# Patient Record
Sex: Female | Born: 1970 | Race: Black or African American | Hispanic: No | Marital: Single | Smoking: Never smoker
Health system: Southern US, Community
[De-identification: ages and names within clinical notes are randomized; demographics above are authoritative.]

## PROBLEM LIST (undated history)

## (undated) DIAGNOSIS — Q600 Renal agenesis, unilateral: Secondary | ICD-10-CM

## (undated) DIAGNOSIS — D631 Anemia in chronic kidney disease: Secondary | ICD-10-CM

## (undated) DIAGNOSIS — N19 Unspecified kidney failure: Secondary | ICD-10-CM

## (undated) DIAGNOSIS — N189 Chronic kidney disease, unspecified: Secondary | ICD-10-CM

## (undated) DIAGNOSIS — E7800 Pure hypercholesterolemia, unspecified: Secondary | ICD-10-CM

## (undated) DIAGNOSIS — I10 Essential (primary) hypertension: Secondary | ICD-10-CM

## (undated) DIAGNOSIS — G43909 Migraine, unspecified, not intractable, without status migrainosus: Secondary | ICD-10-CM

## (undated) HISTORY — PX: THYROIDECTOMY, PARTIAL: SHX18

## (undated) HISTORY — PX: RENAL BIOPSY, PERCUTANEOUS: SUR144

## (undated) HISTORY — PX: KIDNEY TRANSPLANT: SHX239

## (undated) HISTORY — PX: ARTERIOVENOUS FISTULA CREATION FEMORAL: SHX6647

---

## 2010-02-16 ENCOUNTER — Emergency Department: Payer: Self-pay | Admitting: Emergency Medicine

## 2010-02-25 ENCOUNTER — Encounter: Payer: Self-pay | Admitting: Family Medicine

## 2010-03-06 ENCOUNTER — Encounter: Payer: Self-pay | Admitting: Family Medicine

## 2010-04-05 ENCOUNTER — Encounter: Payer: Self-pay | Admitting: Family Medicine

## 2011-02-02 ENCOUNTER — Emergency Department: Payer: Self-pay | Admitting: Emergency Medicine

## 2011-02-03 ENCOUNTER — Ambulatory Visit: Payer: Self-pay | Admitting: Internal Medicine

## 2011-02-04 ENCOUNTER — Ambulatory Visit: Payer: Self-pay | Admitting: Internal Medicine

## 2011-03-07 ENCOUNTER — Ambulatory Visit: Payer: Self-pay | Admitting: Internal Medicine

## 2011-04-06 ENCOUNTER — Ambulatory Visit: Payer: Self-pay | Admitting: Internal Medicine

## 2011-05-12 ENCOUNTER — Ambulatory Visit: Payer: Self-pay | Admitting: Internal Medicine

## 2011-06-06 ENCOUNTER — Ambulatory Visit: Payer: Self-pay | Admitting: Internal Medicine

## 2011-07-30 ENCOUNTER — Emergency Department: Payer: Self-pay | Admitting: Emergency Medicine

## 2011-09-17 ENCOUNTER — Ambulatory Visit: Payer: Self-pay | Admitting: Internal Medicine

## 2011-10-07 ENCOUNTER — Ambulatory Visit: Payer: Self-pay | Admitting: Internal Medicine

## 2011-10-27 ENCOUNTER — Emergency Department: Payer: Self-pay | Admitting: *Deleted

## 2012-03-20 ENCOUNTER — Emergency Department: Payer: Self-pay | Admitting: Emergency Medicine

## 2012-10-09 ENCOUNTER — Emergency Department: Payer: Self-pay | Admitting: Emergency Medicine

## 2013-06-07 IMAGING — CT CT HEAD WITHOUT CONTRAST
2 series · 16 of 30 positions shown, 20 images · non-contrast
Comparison: none

REASON FOR EXAM: heradache
COMMENTS:

[Series 2: without · axial · non-contrast · 0.40mm/px · z∈[-116,+4]mm · 13 of 30 slices shown, 17 images]
[im 3/30  brain]
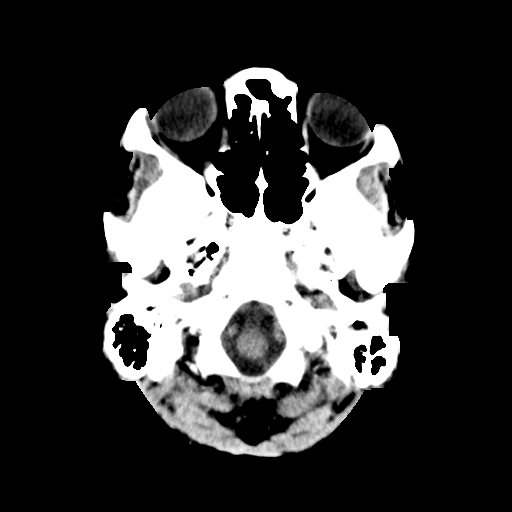
[im 3/30  bone]
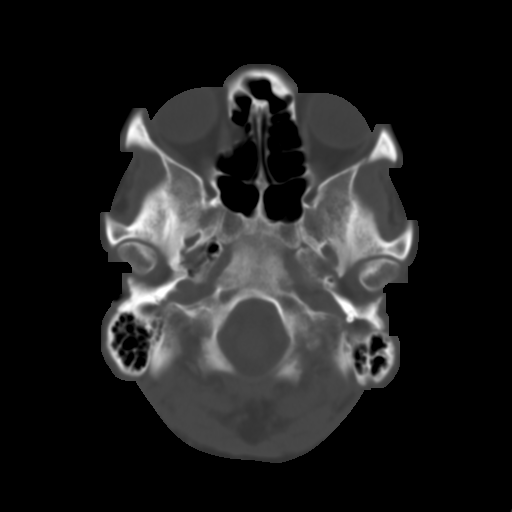
[im 5/30  brain]
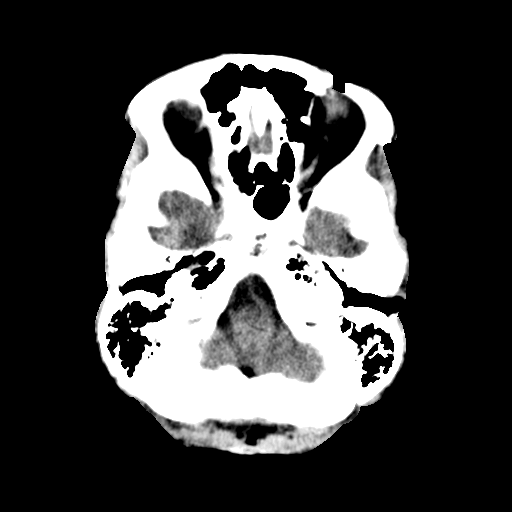
[im 7/30  brain]
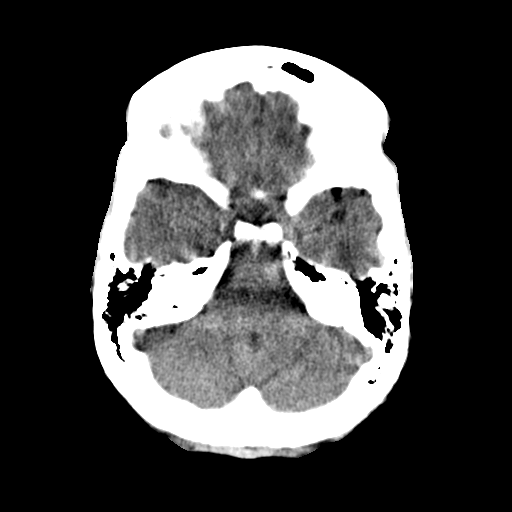
[im 9/30  brain]
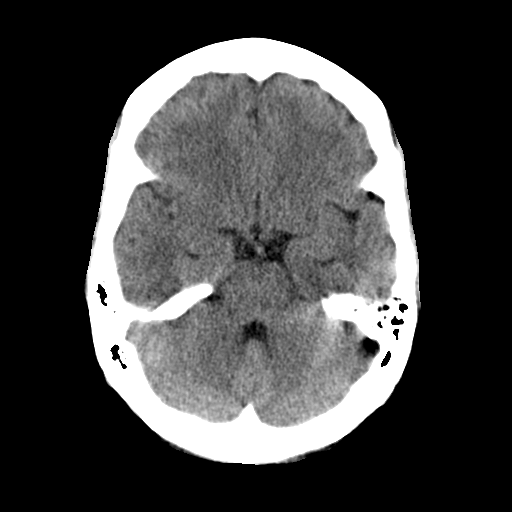
[im 11/30  brain]
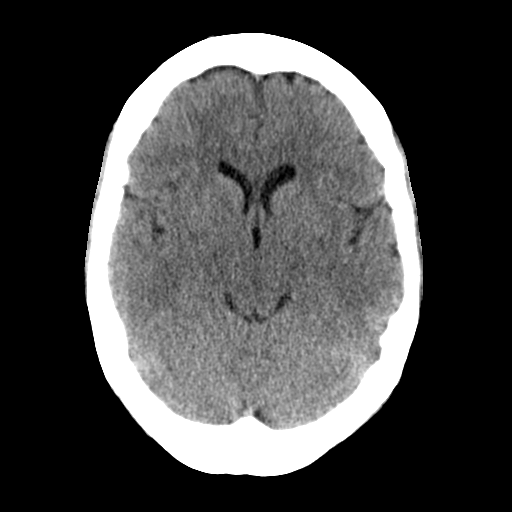
[im 11/30  bone]
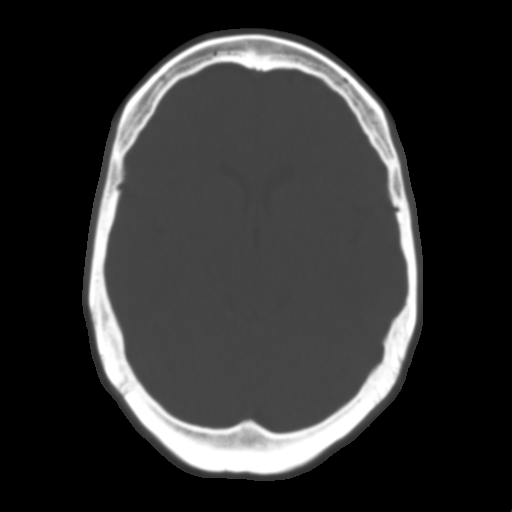
[im 13/30  brain]
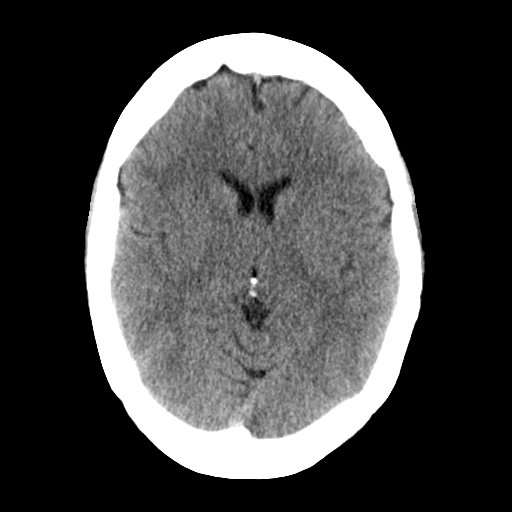
[im 15/30  brain]
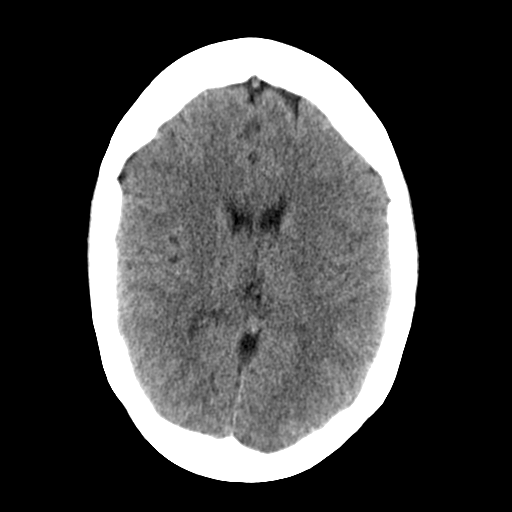
[im 17/30  brain]
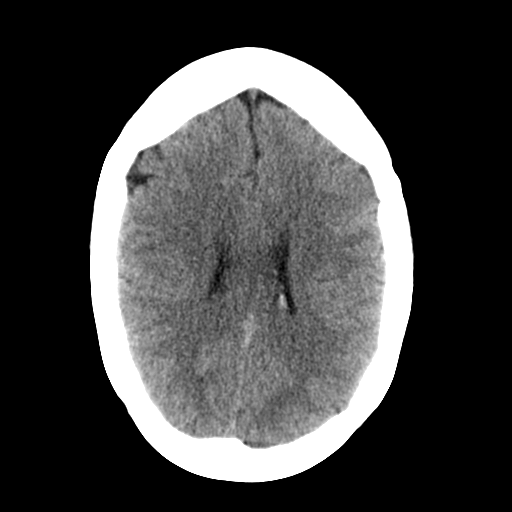
[im 19/30  brain]
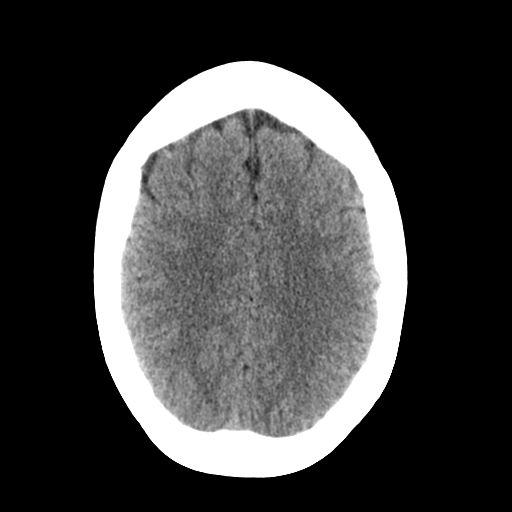
[im 19/30  bone]
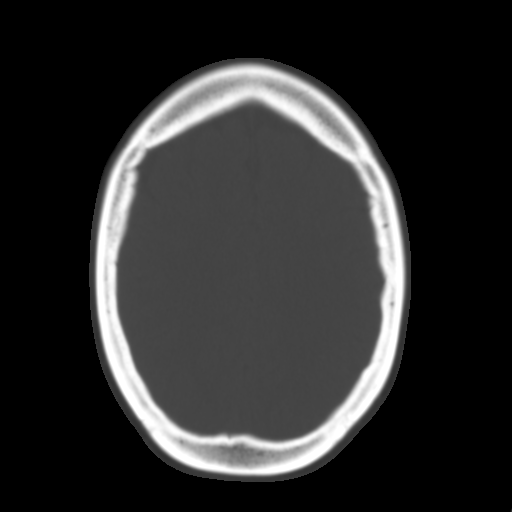
[im 21/30  brain]
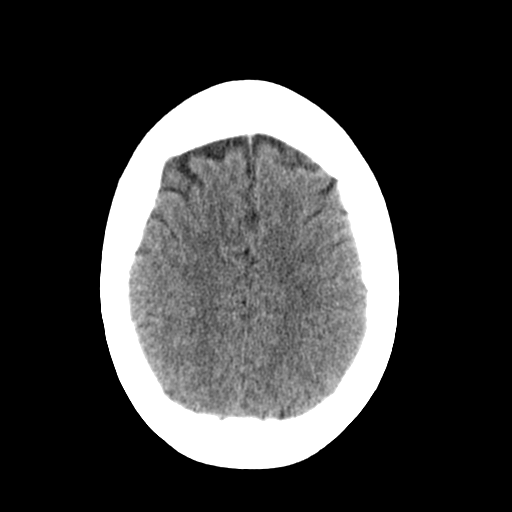
[im 23/30  brain]
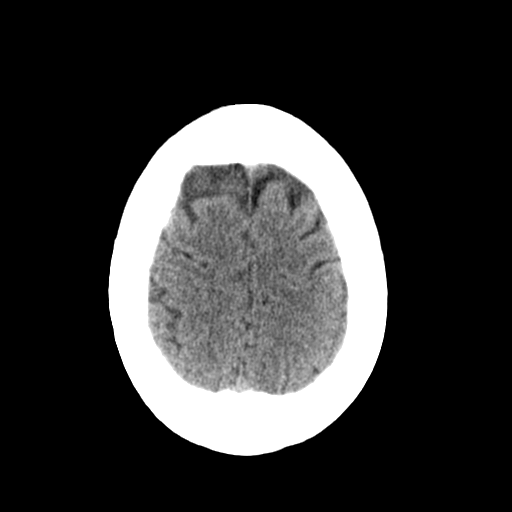
[im 25/30  brain]
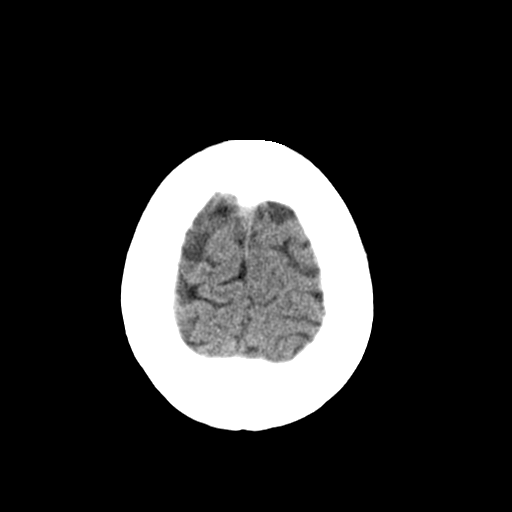
[im 27/30  brain]
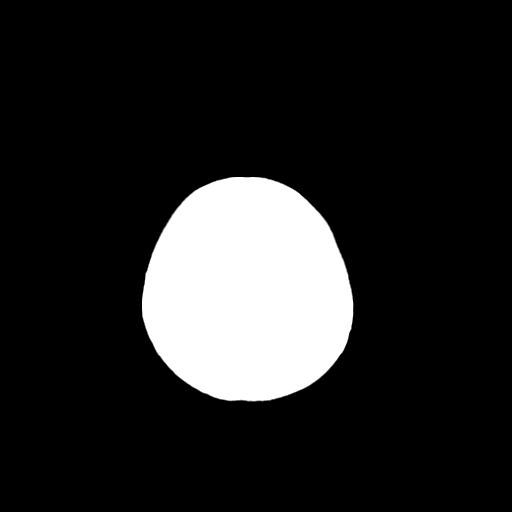
[im 27/30  bone]
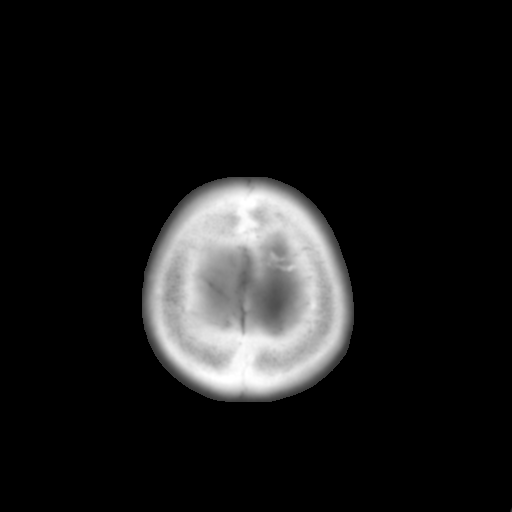

[Series 3: bone · axial · 0.40mm/px · z∈[-116,-76]mm · 3 of 30 slices shown]
[im 3/30  bone]
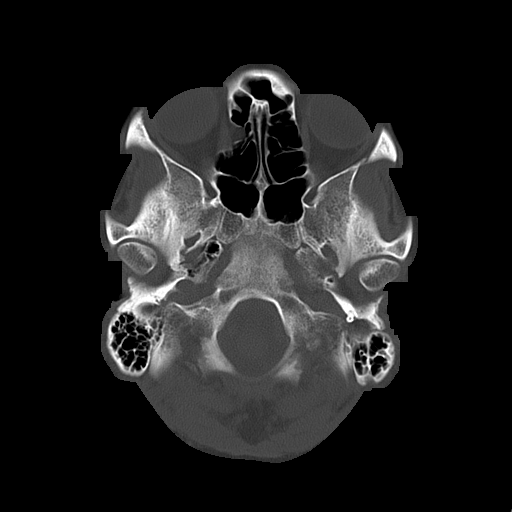
[im 7/30  bone]
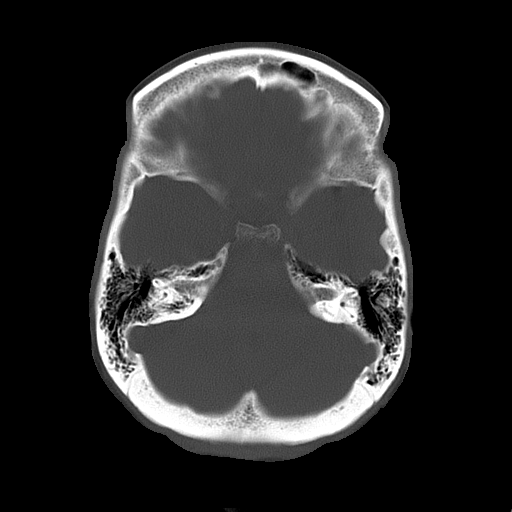
[im 11/30  bone]
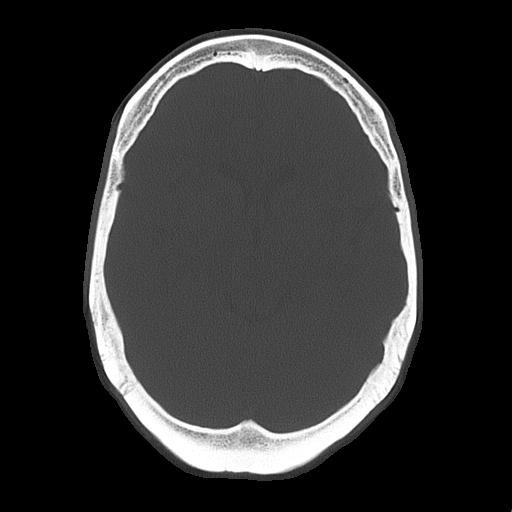

[16 of 30 positions shown; findings below may reference images not displayed]

PROCEDURE:     CT  - CT HEAD WITHOUT CONTRAST  - July 30, 2011  [DATE]

RESULT:     Axial noncontrast CT scanning was performed through the brain at
5 mm intervals and slice thicknesses.

The ventricles are normal in size and position. There is no intracranial
hemorrhage nor intracranial mass effect. The cerebellum and brainstem are
normal in density. At bone window settings the observed portions of the
paranasal sinuses and mastoid air cells are clear. There is no evidence of
an acute skull fracture.
IMPRESSION: I see no acute intracranial abnormality.

## 2013-08-06 ENCOUNTER — Emergency Department: Payer: Self-pay | Admitting: Emergency Medicine

## 2013-08-06 LAB — CBC
HCT: 24.5 % — ABNORMAL LOW (ref 35.0–47.0)
HGB: 7.5 g/dL — ABNORMAL LOW (ref 12.0–16.0)
MCH: 23.3 pg — ABNORMAL LOW (ref 26.0–34.0)
MCHC: 30.8 g/dL — ABNORMAL LOW (ref 32.0–36.0)
MCV: 76 fL — ABNORMAL LOW (ref 80–100)
RDW: 19.6 % — ABNORMAL HIGH (ref 11.5–14.5)

## 2013-08-06 LAB — COMPREHENSIVE METABOLIC PANEL
Albumin: 2.8 g/dL — ABNORMAL LOW (ref 3.4–5.0)
Anion Gap: 6 — ABNORMAL LOW (ref 7–16)
Chloride: 110 mmol/L — ABNORMAL HIGH (ref 98–107)
Creatinine: 3.49 mg/dL — ABNORMAL HIGH (ref 0.60–1.30)
EGFR (African American): 18 — ABNORMAL LOW
EGFR (Non-African Amer.): 15 — ABNORMAL LOW
Osmolality: 280 (ref 275–301)

## 2013-08-07 LAB — URINALYSIS, COMPLETE
Glucose,UR: NEGATIVE mg/dL (ref 0–75)
Ketone: NEGATIVE
Leukocyte Esterase: NEGATIVE
Nitrite: NEGATIVE
Protein: >=500
Specific Gravity: 1.009 (ref 1.003–1.030)

## 2013-11-26 ENCOUNTER — Ambulatory Visit: Payer: Self-pay | Admitting: Family Medicine

## 2013-12-27 ENCOUNTER — Ambulatory Visit: Payer: Self-pay | Admitting: Family Medicine

## 2014-07-05 ENCOUNTER — Emergency Department: Payer: Self-pay | Admitting: Emergency Medicine

## 2014-07-05 LAB — MAGNESIUM: Magnesium: 1.7 mg/dL — ABNORMAL LOW

## 2014-07-05 LAB — COMPREHENSIVE METABOLIC PANEL
ALT: 12 U/L — AB
ANION GAP: 7 (ref 7–16)
Albumin: 2.6 g/dL — ABNORMAL LOW (ref 3.4–5.0)
Alkaline Phosphatase: 68 U/L
BUN: 31 mg/dL — AB (ref 7–18)
Bilirubin,Total: 0.5 mg/dL (ref 0.2–1.0)
CALCIUM: 8.6 mg/dL (ref 8.5–10.1)
CO2: 21 mmol/L (ref 21–32)
CREATININE: 4.05 mg/dL — AB (ref 0.60–1.30)
Chloride: 110 mmol/L — ABNORMAL HIGH (ref 98–107)
EGFR (African American): 15 — ABNORMAL LOW
EGFR (Non-African Amer.): 13 — ABNORMAL LOW
Glucose: 72 mg/dL (ref 65–99)
Osmolality: 281 (ref 275–301)
Potassium: 4.7 mmol/L (ref 3.5–5.1)
SGOT(AST): 18 U/L (ref 15–37)
SODIUM: 138 mmol/L (ref 136–145)
Total Protein: 6.6 g/dL (ref 6.4–8.2)

## 2014-07-05 LAB — CBC
HCT: 36.9 % (ref 35.0–47.0)
HGB: 11.9 g/dL — ABNORMAL LOW (ref 12.0–16.0)
MCH: 28.8 pg (ref 26.0–34.0)
MCHC: 32.4 g/dL (ref 32.0–36.0)
MCV: 89 fL (ref 80–100)
Platelet: 141 10*3/uL — ABNORMAL LOW (ref 150–440)
RBC: 4.15 10*6/uL (ref 3.80–5.20)
RDW: 14.4 % (ref 11.5–14.5)
WBC: 4.6 10*3/uL (ref 3.6–11.0)

## 2014-07-05 LAB — PHOSPHORUS: Phosphorus: 3 mg/dL (ref 2.5–4.9)

## 2015-11-05 IMAGING — US US BREAST*R* LIMITED INC AXILLA
1 series · 13 of 25 positions shown · non-contrast
Comparison: Screening mammogram dated 11/27/2013.

CLINICAL DATA: Possible bilateral breast masses at recent screening
mammography.

EXAM:
DIGITAL DIAGNOSTIC  BILATERAL MAMMOGRAM WITH CAD
ULTRASOUND BILATERAL BREAST

[Series 1: us breast*right* limited inc axilla · 0.08mm/px · 13 of 26 slices shown]
[im 1/26]
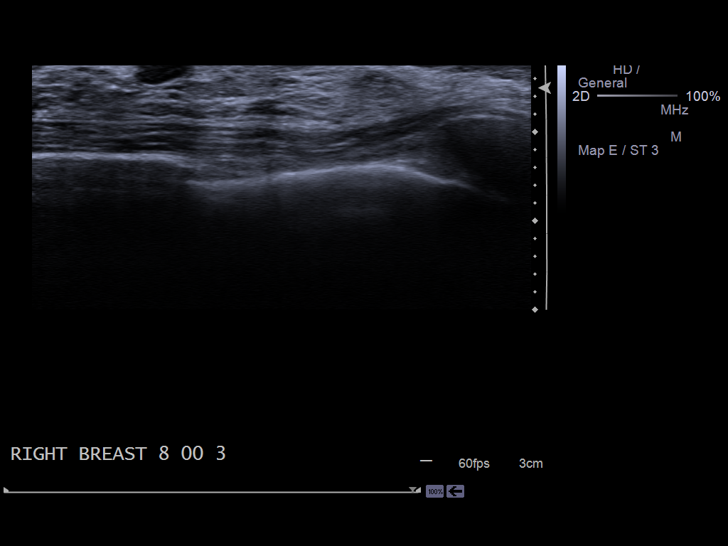
[im 3/26]
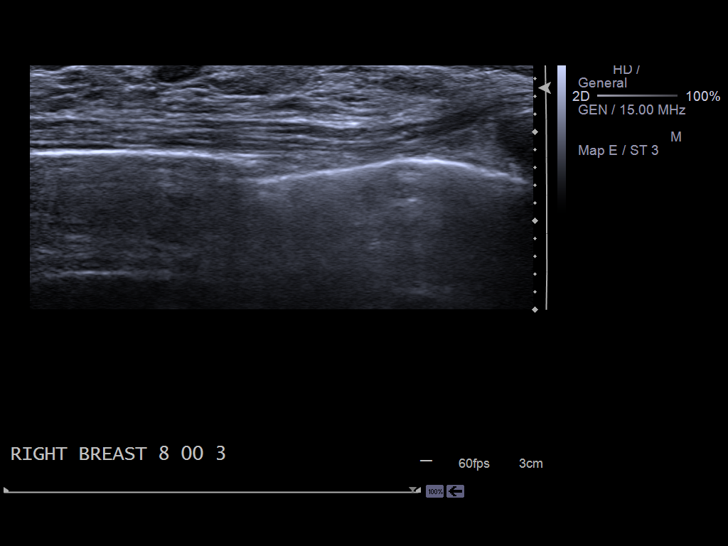
[im 5/26]
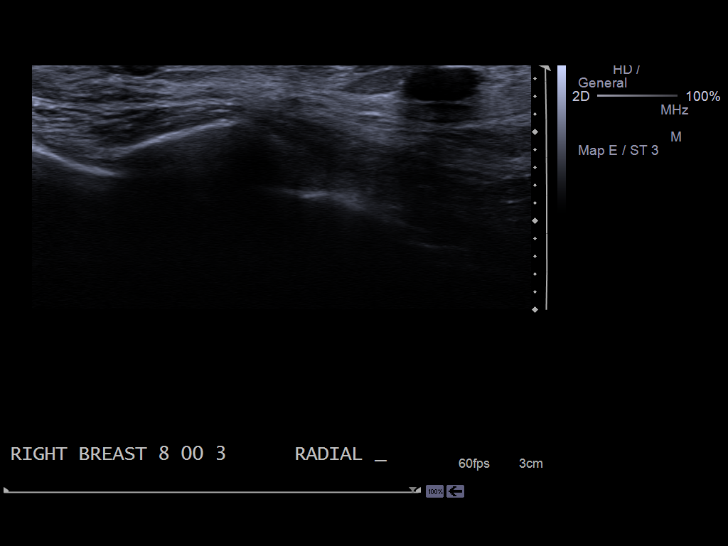
[im 7/26]
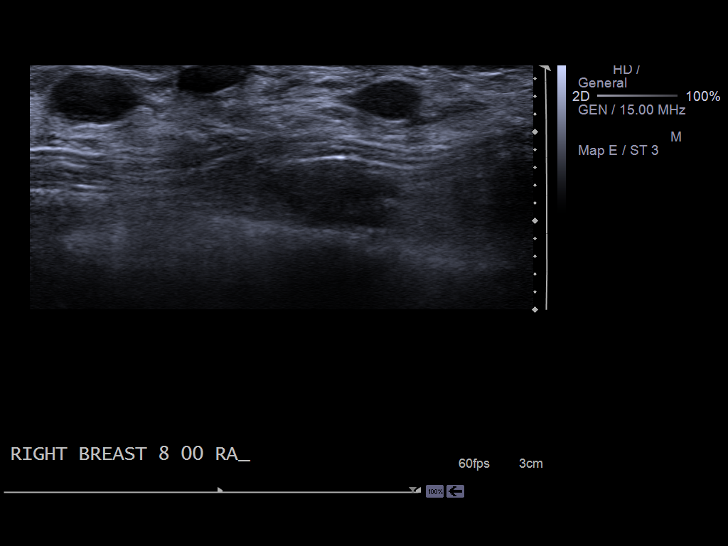
[im 9/26]
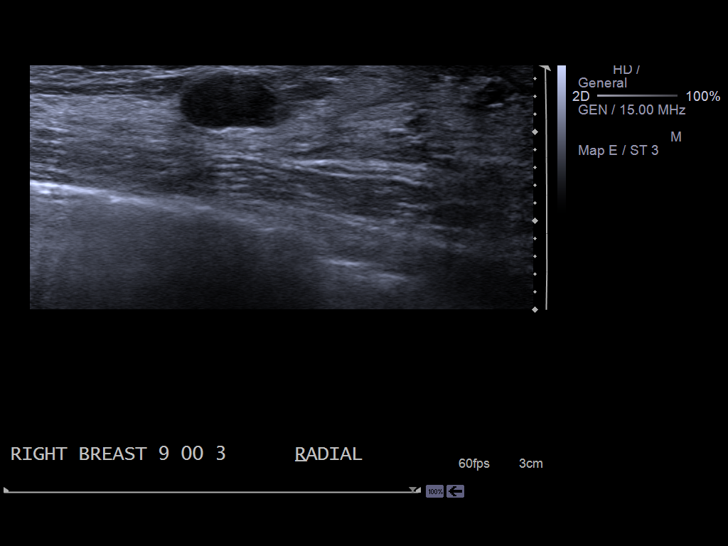
[im 11/26]
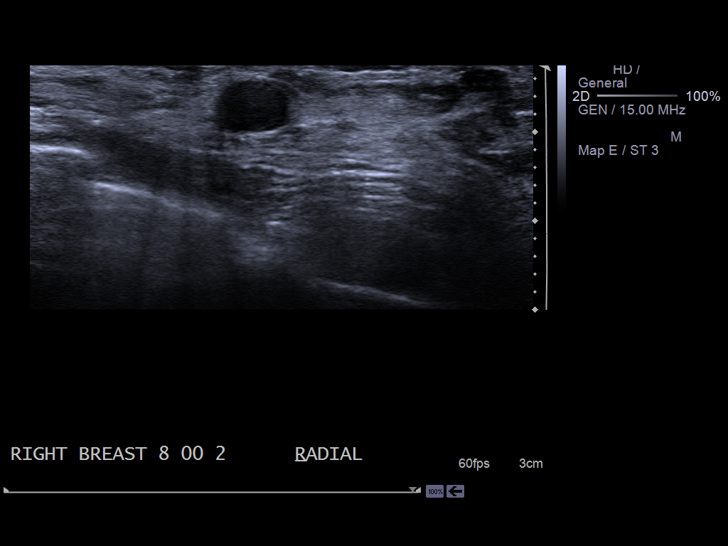
[im 13/26]
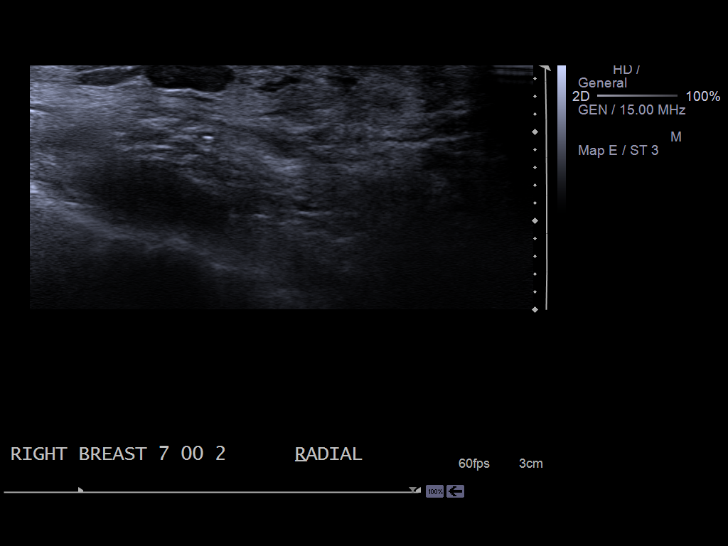
[im 15/26]
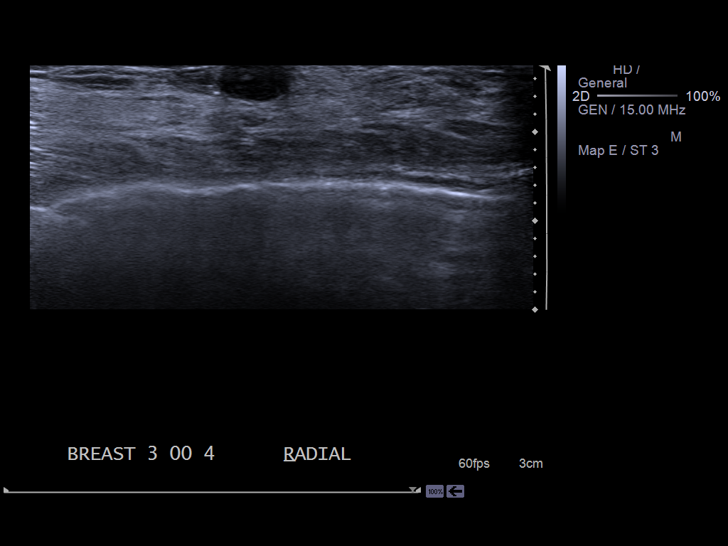
[im 17/26]
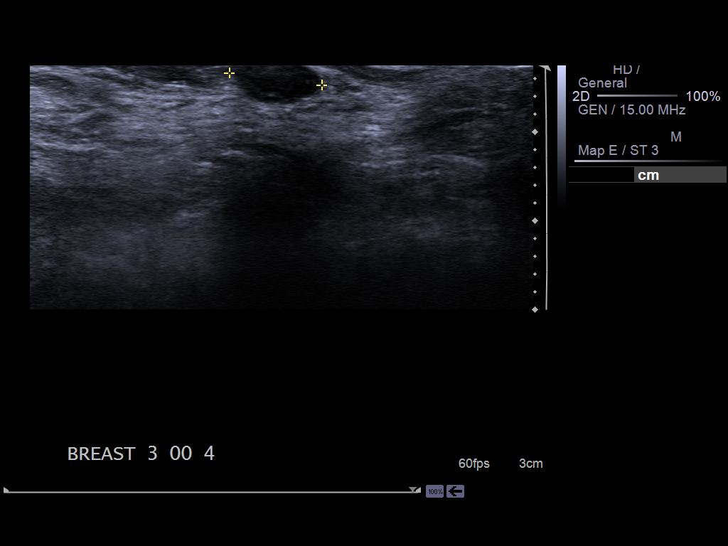
[im 19/26]
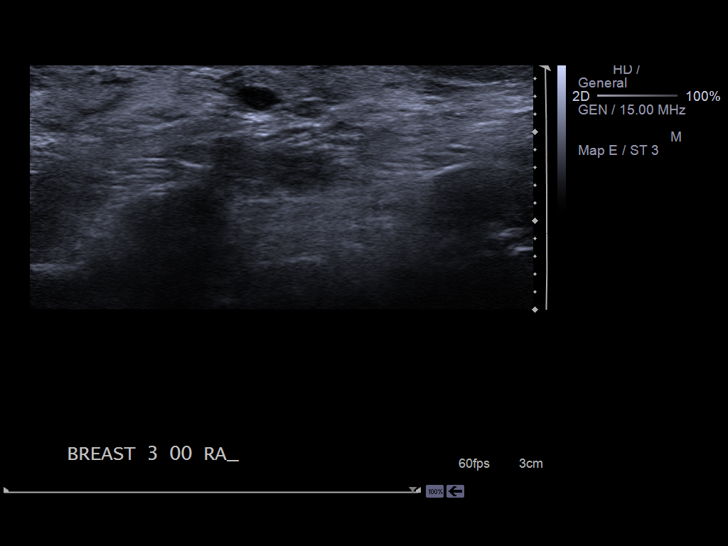
[im 21/26]
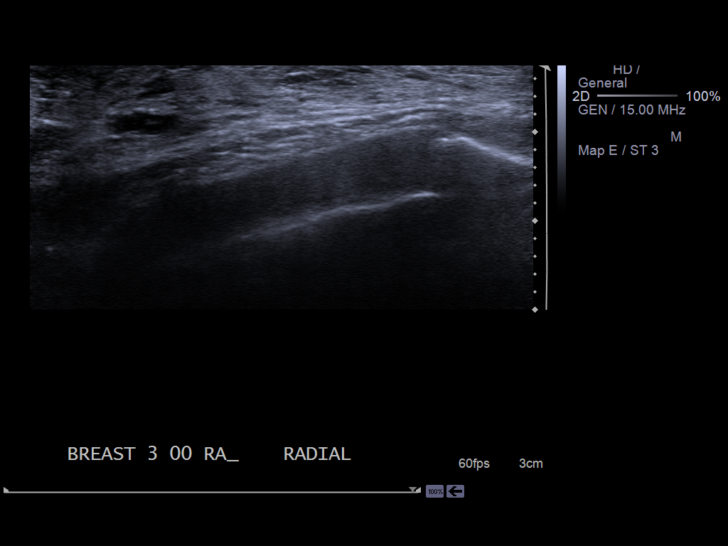
[im 23/26]
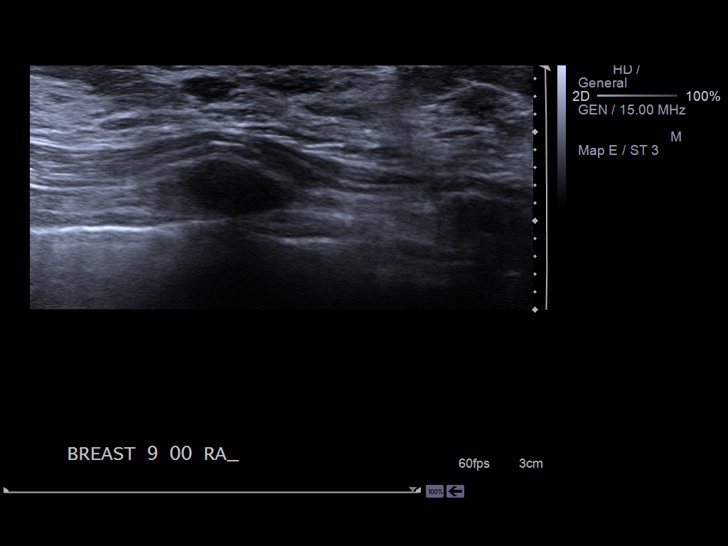
[im 26/26]
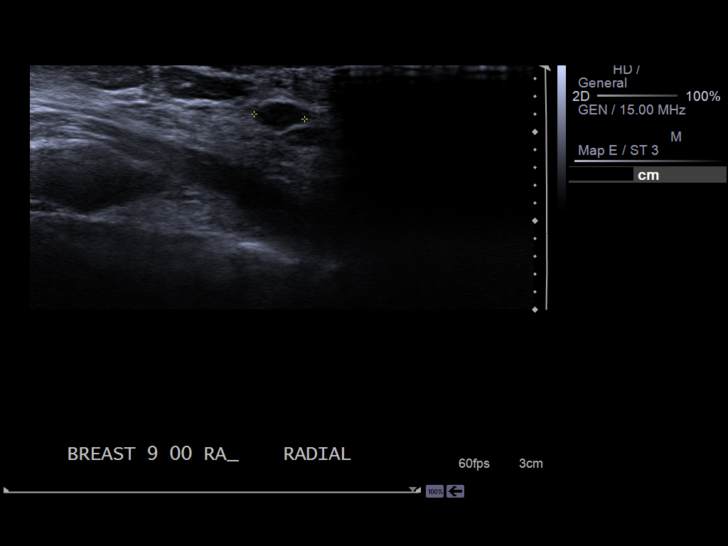

[13 of 25 positions shown; findings below may reference images not displayed]

ACR Breast Density Category c: The breast tissue is heterogeneously
dense, which may obscure small masses.
FINDINGS: True lateral and spot compression views of the left breast confirm a
partially obscured rounded mass in the central portion of the
breast. The visualized margins are circumscribed.

Spot compression views of the right breast confirm 2 oval, partially
obscured masses in the lateral right breast. The visualized margins
are circumscribed.

Mammographic images were processed with CAD.

On physical exam, there are several palpable areas of nodularity
measuring 1 cm or less in diameter in the lower outer retroareolar
and periareolar region of the right breast. No palpable abnormality
in the left breast.

Ultrasound is performed, showing multiple oval, horizontally
oriented, circumscribed, hypoechoic masses in both breasts. These
are all identical in appearance. The largest is in the 9 o'clock
position of the right breast, 3 cm from the nipple, measuring 1.1 cm
in maximum diameter.
IMPRESSION: Multiple bilateral fibroadenomata.  No evidence of malignancy.

RECOMMENDATION:
Bilateral screening mammogram in 1 year.

I have discussed the findings and recommendations with the patient.
Results were also provided in writing at the conclusion of the
visit.

BI-RADS CATEGORY  2: Benign finding(s).

## 2016-05-24 ENCOUNTER — Other Ambulatory Visit: Payer: Self-pay | Admitting: Family Medicine

## 2016-05-24 DIAGNOSIS — Z1231 Encounter for screening mammogram for malignant neoplasm of breast: Secondary | ICD-10-CM

## 2017-05-21 ENCOUNTER — Encounter: Payer: Self-pay | Admitting: Gynecology

## 2017-05-21 ENCOUNTER — Ambulatory Visit
Admission: EM | Admit: 2017-05-21 | Discharge: 2017-05-21 | Disposition: A | Discharge status: Home / Self Care | Payer: 59 | Attending: Emergency Medicine | Admitting: Emergency Medicine

## 2017-05-21 DIAGNOSIS — R0982 Postnasal drip: Secondary | ICD-10-CM

## 2017-05-21 DIAGNOSIS — J029 Acute pharyngitis, unspecified: Secondary | ICD-10-CM | POA: Diagnosis not present

## 2017-05-21 HISTORY — DX: Pure hypercholesterolemia, unspecified: E78.00

## 2017-05-21 HISTORY — DX: Renal agenesis, unilateral: Q60.0

## 2017-05-21 HISTORY — DX: Anemia in chronic kidney disease: D63.1

## 2017-05-21 HISTORY — DX: Migraine, unspecified, not intractable, without status migrainosus: G43.909

## 2017-05-21 HISTORY — DX: Essential (primary) hypertension: I10

## 2017-05-21 HISTORY — DX: Chronic kidney disease, unspecified: N18.9

## 2017-05-21 HISTORY — DX: Unspecified kidney failure: N19

## 2017-05-21 LAB — RAPID STREP SCREEN (MED CTR MEBANE ONLY): STREPTOCOCCUS, GROUP A SCREEN (DIRECT): NEGATIVE

## 2017-05-21 MED ORDER — IPRATROPIUM BROMIDE 0.06 % NA SOLN: 2.0000 | Freq: Four times a day (QID) | NASAL | 0 refills | Status: DC

## 2017-05-21 MED ORDER — MAGIC MOUTHWASH W/LIDOCAINE: 10.0000 mL | Freq: Three times a day (TID) | ORAL | 0 refills | Status: DC | PRN

## 2017-05-21 NOTE — Discharge Instructions (Signed)
your rapid strep was negative today, so we have sent off a throat culture.  We will contact you and call in the appropriate antibiotics if your culture comes back positive for an infection requiring antibiotic treatment.  Give [place] a working phone number.   1 gram of Tylenol and 400- 600 mg ibuprofen together 3-4 times a day as needed for pain.  Make sure you drink plenty of extra fluids.  Some people find salt water gargles and  Traditional Medicinal's "Throat Coat" tea helpful. Take 5 mL of liquid Benadryl and 5 mL of Maalox. Mix it together, and then hold it in your mouth for as long as you can and then swallow. You may do this 4 times a day.    Go to www.goodrx.com to look up your medications. This will give you a list of where you can find your prescriptions at the most affordable prices. Or ask the pharmacist what the cash price is. This can be less expensive than what you would pay with insurance.

## 2017-05-21 NOTE — ED Provider Notes (Signed)
HPI  SUBJECTIVE:  Patient reports right-sided sore throat starting 1-2 days ago, got acutely worse last night. Sx worse with swallowing, yawning.  Sx better with nothing. Has been taking : cold/ Allergy geltabs, Listerine and increasing fluids  w/ o relief. States the pain radiates to her right ear. She reports burning on the top of her mouth starting an hour and a half ago  no Fever  No Cough, but the patient reports nasal congestion, rhinorrhea, postnasal drip No Myalgias No Headache No Rash     No Recent Strep Exposure but patient states that she has a grandson with URI-like symptoms  No Abdominal Pain No reflux sxs No Allergy sxs  No Breathing difficulty, voice changes. She states that she feels like the right side of her throat is very swollen No Drooling No stiff neck No Trismus No abx in past month.  No antipyretic in past 6-8 hrs  past medical history solitary kidney, end-stage renal disease on hemodialysis however she still makes urine. She has anemia secondary to chronic kidney disease, hypertension, migraines, GERD, allergies, PE status post IVC filter.  LMP: Second week of May. Denies possibility of being pregnant.  PMD: Dr. Jacklynn Bue at [facility].  Past Medical History:  Diagnosis Date  . Anemia in chronic kidney disease (CKD)   . Hypercholesteremia   . Hypertension   . Kidney failure   . Migraine   . Solitary kidney, congenital     Past Surgical History:  Procedure Laterality Date  . ARTERIOVENOUS FISTULA CREATION FEMORAL    . CESAREAN SECTION    . RENAL BIOPSY, PERCUTANEOUS      No family history on file.  Social History  Substance Use Topics  . Smoking status: Never Smoker  . Smokeless tobacco: Never Used  . Alcohol use No    No current facility-administered medications for this encounter.   Current Outpatient Prescriptions:  .  cetirizine (ZYRTEC) 10 MG tablet, Take 10 mg by mouth daily., Disp: , Rfl:  .  fluticasone (FLONASE) 50  MCG/ACT nasal spray, Place into both nostrils daily., Disp: , Rfl:  .  lidocaine-prilocaine (EMLA) cream, Apply 1 application topically as needed., Disp: , Rfl:  .  losartan (COZAAR) 100 MG tablet, Take 100 mg by mouth daily., Disp: , Rfl:  .  nortriptyline (PAMELOR) 10 MG capsule, Take 10 mg by mouth at bedtime., Disp: , Rfl:  .  omeprazole (PRILOSEC) 20 MG capsule, Take 20 mg by mouth daily., Disp: , Rfl:  .  sevelamer carbonate (RENVELA) 800 MG tablet, Take 800 mg by mouth 3 (three) times daily with meals., Disp: , Rfl:  .  simvastatin (ZOCOR) 10 MG tablet, Take 10 mg by mouth daily., Disp: , Rfl:  .  ipratropium (ATROVENT) 0.06 % nasal spray, Place 2 sprays into both nostrils 4 (four) times daily. 3-4 times/ day, Disp: 15 mL, Rfl: 0 .  magic mouthwash w/lidocaine SOLN, Take 10 mLs by mouth 3 (three) times daily as needed for mouth pain. Swish and spit do not swallow, Disp: 200 mL, Rfl: 0  Allergies  Allergen Reactions  . Codeine Nausea And Vomiting  . Iodine Swelling  . Shellfish Allergy Swelling     ROS  As noted in HPI.   Physical Exam  BP (!) 132/91 (BP Location: Right Arm)   Pulse 86   Temp 98.1 F (36.7 C) (Oral)   Resp 16   Ht 5\' 2"  (1.575 m)   Wt 148 lb (67.1 kg)  LMP 04/06/2017   SpO2 100%   BMI 27.07 kg/m   Constitutional: Well developed, well nourished, no acute distress Eyes:  EOMI, conjunctiva normal bilaterally HENT: Normocephalic, atraumatic,mucus membranes moist. TMs normal bilaterally. + mild nasal congestion + erythematous oropharynx. Tonsils surgically absent. Uvula midline. Positive postnasal drip  Respiratory: Normal inspiratory effort Cardiovascular: Normal rate, no murmurs, rubs, gallops GI: nondistended, nontender. No appreciable splenomegaly skin: No rash, skin intact Lymph: + Right-sided tender cervical LN  Musculoskeletal: no deformities Neurologic: Alert & oriented x 3, no focal neuro deficits Psychiatric: Speech and behavior  appropriate.  ED Course   Medications - No data to display  Orders Placed This Encounter  Procedures  . Rapid strep screen    Standing Status:   Standing    Number of Occurrences:   1  . Culture, group A strep    Standing Status:   Standing    Number of Occurrences:   1    Results for orders placed or performed during the hospital encounter of 05/21/17 (from the past 24 hour(s))  Rapid strep screen     Status: None   Collection Time: 05/21/17 12:37 PM  Result Value Ref Range   Streptococcus, Group A Screen (Direct) NEGATIVE NEGATIVE   No results found.  ED Clinical Impression  Sore throat  Postnasal drip   ED Assessment/Plan  Doubt peritonsillar abscess or retropharyngeal abscess at this point in time. Rapid strep negative. Obtaining throat culture to guide antibiotic treatment. Discussed this with patient. We'll contact them if culture is positive, and will call in Appropriate antibiotics. Patient home with ibuprofen, Tylenol, Benadryl/Maalox mixture and if this does not work, we'll send home with Magic mouthwash. Also Atrovent nasal spray for the postnasal drip.. Patient to followup with PMD when necessary,  Discussed labs, MDM, plan and followup with patient  Discussed sn/sx that should prompt return to the UC or ED. Patient  agrees with plan.   Meds ordered this encounter  Medications  . nortriptyline (PAMELOR) 10 MG capsule    Sig: Take 10 mg by mouth at bedtime.  . sevelamer carbonate (RENVELA) 800 MG tablet    Sig: Take 800 mg by mouth 3 (three) times daily with meals.  . simvastatin (ZOCOR) 10 MG tablet    Sig: Take 10 mg by mouth daily.  . fluticasone (FLONASE) 50 MCG/ACT nasal spray    Sig: Place into both nostrils daily.  Marland Kitchen lidocaine-prilocaine (EMLA) cream    Sig: Apply 1 application topically as needed.  Marland Kitchen losartan (COZAAR) 100 MG tablet    Sig: Take 100 mg by mouth daily.  . cetirizine (ZYRTEC) 10 MG tablet    Sig: Take 10 mg by mouth daily.  Marland Kitchen  omeprazole (PRILOSEC) 20 MG capsule    Sig: Take 20 mg by mouth daily.  Marland Kitchen ipratropium (ATROVENT) 0.06 % nasal spray    Sig: Place 2 sprays into both nostrils 4 (four) times daily. 3-4 times/ day    Dispense:  15 mL    Refill:  0  . magic mouthwash w/lidocaine SOLN    Sig: Take 10 mLs by mouth 3 (three) times daily as needed for mouth pain. Swish and spit do not swallow    Dispense:  200 mL    Refill:  0     *This clinic note was created using [employer]. Therefore, there may be occasional mistakes despite careful proofreading.    Melynda Ripple, MD 05/21/17 1328

## 2017-05-21 NOTE — ED Triage Notes (Addendum)
Patient c/o sore throat x couple days. Per patient painful to eat.

## 2017-05-24 LAB — CULTURE, GROUP A STREP (THRC)

## 2017-05-26 ENCOUNTER — Telehealth: Payer: Self-pay

## 2017-05-26 MED ORDER — PENICILLIN V POTASSIUM 500 MG PO TABS: 500.0000 mg | ORAL_TABLET | Freq: Two times a day (BID) | ORAL | 0 refills | Status: AC

## 2017-05-26 NOTE — Telephone Encounter (Signed)
-----   Message from Melynda Ripple, MD sent at 05/23/2017  6:10 PM EDT ----- Please call in Penicillin VK 500 mg by mouth twice a day for 10 days #20, generic okay no refills.  AM

## 2017-09-22 ENCOUNTER — Ambulatory Visit
Admission: RE | Admit: 2017-09-22 | Discharge: 2017-09-22 | Disposition: A | Discharge status: Home / Self Care | Payer: 59 | Source: Ambulatory Visit | Attending: Nephrology | Admitting: Nephrology

## 2017-09-22 MED ORDER — SODIUM CHLORIDE 0.9 % IV SOLN: 1.0000 g | Freq: Once | INTRAVENOUS | Status: DC

## 2017-09-27 ENCOUNTER — Ambulatory Visit
Admission: RE | Admit: 2017-09-27 | Discharge: 2017-09-27 | Disposition: A | Discharge status: Home / Self Care | Payer: 59 | Source: Ambulatory Visit | Attending: Nephrology | Admitting: Nephrology

## 2017-09-27 MED ORDER — SODIUM CHLORIDE 0.9 % IV SOLN
1.0000 g | Freq: Once | INTRAVENOUS | Status: AC
  Administered 2017-09-27: 1 g via INTRAVENOUS
  Filled 2017-09-27: qty 10

## 2017-09-27 MED ORDER — SODIUM CHLORIDE FLUSH 0.9 % IV SOLN
INTRAVENOUS | Status: AC
  Filled 2017-09-27: qty 10

## 2017-09-27 NOTE — Discharge Instructions (Signed)
Hypocalcemia, Adult Hypocalcemia is when the level of calcium in a person's blood is below normal. Calcium is a mineral that is used by the body in many ways. A lack of blood calcium can affect the heart and muscles, make the bones more likely to break, and cause other problems. What are the causes? This condition may be caused by:  Decreased production (hypoparathyroidism) or improper use of parathyroid hormone.  Problems with the parathyroid glands or surgical removal of these glands.  Problems with parathyroid function after removal of the thyroid gland.  Lack (deficiency) of vitamin D or magnesium or both.  Kidney problems.  Less common causes include:  Intestinal problems that interfere with nutrient absorption.  Alcoholism.  Low levels of a body protein that is called albumin.  Inflammation of the pancreas (pancreatitis).  Certain medicines.  Severe infections (sepsis).  Certain diseases, such as sarcoidosis or hemochromatosis, that cause the parathyroid glands to be filled with cells or substances that are not normally present.  Breakdown of large amounts of muscle fiber.  High levels of phosphate in the body.  Cancer.  Massive blood transfusions, which usually occur with severe trauma.  What are the signs or symptoms? Symptoms of this condition include:  Numbness and tingling in the fingers, toes, or around the mouth.  Muscle aches or cramps, especially in the legs, feet, and back.  Muscle twitches.  Abdominal cramping or pain.  Memory problems, confusion, or difficulty thinking.  Depression, anxiety, irritability, or changes in personality.  Fainting.  Chest pain.  Difficulty swallowing.  Changes in the sound of the voice.  Shortness of breath or wheezing.  General weakness and fatigue.  Symptoms of severe hypocalcemia include:  Shaking uncontrollably (seizures).  Seizure of the voice box (laryngospasm).  Fast heartbeats (palpitations)  and abnormal heart rhythms (arrhythmias).  Long-term symptoms of this condition include:  Coarse, brittle hair and nails.  Dry skin or lasting (chronic) skin diseases (psoriasis, eczema,, or dermatitis).  Clouding of the eye lens (cataracts).  How is this diagnosed? This condition is usually diagnosed with a blood test. You may also have other tests to help determine the underlying cause of the condition. For example, a test may be done that records the electrical activity of the heart (electrocardiogram,or ECG). How is this treated? Treatment for this condition may include:  Calcium given by mouth (orally) or given through an IV tube that is inserted into one of your veins. The method used for giving calcium will depend on the severity of the condition.  Other minerals (electrolytes), such as magnesium.  Other treatment will depend on the cause of the condition. Follow these instructions at home:  Follow diet instructions from your health care provider or dietitian.  Take supplements only as told by your health care provider.  Keep all follow-up visits as told by your health care provider. This is important. Contact a health care provider if:  You have increased fatigue.  You have increased muscle twitching.  You have new swelling in the feet, ankles, or legs.  You develop changes in mood, memory, or personality. Get help right away if:  You have chest pain.  You have persistent rapid or irregular heartbeats.  You have difficulty breathing.  You faint.  You start to have seizures.  You have confusion. This information is not intended to replace advice given to you by your health care provider. Make sure you discuss any questions you have with your health care provider. Document Released: 05/12/2010 Document   Revised: 04/29/2016 Document Reviewed: 04/09/2015 Elsevier Interactive Patient Education  2018 [employer]  

## 2017-10-04 ENCOUNTER — Ambulatory Visit
Admission: RE | Admit: 2017-10-04 | Discharge: 2017-10-04 | Disposition: A | Discharge status: Home / Self Care | Payer: 59 | Source: Ambulatory Visit | Attending: Nephrology | Admitting: Nephrology

## 2017-10-04 MED ORDER — SODIUM CHLORIDE 0.9 % IV SOLN
1.0000 g | Freq: Once | INTRAVENOUS | Status: DC
  Filled 2017-10-04: qty 10

## 2017-10-11 ENCOUNTER — Ambulatory Visit
Admission: RE | Admit: 2017-10-11 | Discharge: 2017-10-11 | Disposition: A | Discharge status: Home / Self Care | Payer: 59 | Source: Ambulatory Visit | Attending: Nephrology | Admitting: Nephrology

## 2017-10-11 MED ORDER — SODIUM CHLORIDE 0.9 % IV SOLN
1.0000 g | Freq: Once | INTRAVENOUS | Status: DC
  Filled 2017-10-11: qty 10

## 2017-10-11 NOTE — OR Nursing (Signed)
Patient was a no show for her appointment today.  Unable to contact patient and her voicemail is full.  Anderson Malta at Dr Elwyn Lade office notified.

## 2017-11-10 ENCOUNTER — Other Ambulatory Visit: Payer: Self-pay

## 2017-11-10 ENCOUNTER — Encounter: Payer: Self-pay | Admitting: Student in an Organized Health Care Education/Training Program

## 2017-11-10 ENCOUNTER — Ambulatory Visit
Payer: 59 | Attending: Student in an Organized Health Care Education/Training Program | Admitting: Student in an Organized Health Care Education/Training Program

## 2017-11-10 VITALS — BP 164/96 | HR 79 | Temp 97.9°F | Resp 16 | Ht 62.0 in | Wt 149.0 lb

## 2017-11-10 DIAGNOSIS — E7800 Pure hypercholesterolemia, unspecified: Secondary | ICD-10-CM | POA: Insufficient documentation

## 2017-11-10 DIAGNOSIS — N186 End stage renal disease: Secondary | ICD-10-CM | POA: Diagnosis not present

## 2017-11-10 DIAGNOSIS — Z992 Dependence on renal dialysis: Secondary | ICD-10-CM | POA: Diagnosis not present

## 2017-11-10 DIAGNOSIS — Z885 Allergy status to narcotic agent status: Secondary | ICD-10-CM | POA: Diagnosis not present

## 2017-11-10 DIAGNOSIS — Z91013 Allergy to seafood: Secondary | ICD-10-CM | POA: Diagnosis not present

## 2017-11-10 DIAGNOSIS — Q600 Renal agenesis, unilateral: Secondary | ICD-10-CM | POA: Insufficient documentation

## 2017-11-10 DIAGNOSIS — Z79899 Other long term (current) drug therapy: Secondary | ICD-10-CM | POA: Insufficient documentation

## 2017-11-10 DIAGNOSIS — G43909 Migraine, unspecified, not intractable, without status migrainosus: Secondary | ICD-10-CM

## 2017-11-10 DIAGNOSIS — I120 Hypertensive chronic kidney disease with stage 5 chronic kidney disease or end stage renal disease: Secondary | ICD-10-CM | POA: Diagnosis not present

## 2017-11-10 DIAGNOSIS — G43009 Migraine without aura, not intractable, without status migrainosus: Secondary | ICD-10-CM | POA: Diagnosis not present

## 2017-11-10 DIAGNOSIS — D631 Anemia in chronic kidney disease: Secondary | ICD-10-CM | POA: Diagnosis not present

## 2017-11-10 DIAGNOSIS — G894 Chronic pain syndrome: Secondary | ICD-10-CM | POA: Diagnosis not present

## 2017-11-10 DIAGNOSIS — G44009 Cluster headache syndrome, unspecified, not intractable: Secondary | ICD-10-CM | POA: Insufficient documentation

## 2017-11-10 MED ORDER — TOPIRAMATE 25 MG PO TABS: ORAL_TABLET | ORAL | 2 refills | Status: DC

## 2017-11-10 MED ORDER — TIZANIDINE HCL 4 MG PO TABS: ORAL_TABLET | ORAL | 1 refills | Status: DC

## 2017-11-10 NOTE — Patient Instructions (Signed)
1. Topamax 25 mg twice daily, extra 25 mg dose after dialysis for migraine prevention 2. Tizanidine 4 mg up to twice daily for muscle spasms, headaches

## 2017-11-10 NOTE — Progress Notes (Signed)
Safety precautions to be maintained throughout the outpatient stay will include: orient to surroundings, keep bed in low position, maintain call bell within reach at all times, provide assistance with transfer out of bed and ambulation.  

## 2017-11-10 NOTE — Progress Notes (Signed)
Patient's Name: Helen Gordon  MRN: [id number]  Referring Provider: Anthonette Legato, MD  DOB: 1971-04-07  PCP: Theotis Burrow, MD  DOS: 11/10/2017  Note by: Gillis Santa, MD  Service setting: Ambulatory outpatient  Specialty: Interventional Pain Management  Location: [facility] (AMB) Pain Management Facility  Visit type: Initial Patient Evaluation  Patient type: New Patient   Primary Reason(s) for Visit: Encounter for initial evaluation of one or more chronic problems (new to examiner) potentially causing chronic pain, and posing a threat to normal musculoskeletal function. (Level of risk: High) CC: Headache  HPI  Ms. Olazabal is a 46 y.o. year old, female patient, who comes today to see [place] for the first time for an initial evaluation of her chronic pain. 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 Today she comes in for evaluation of her Headache  Pain Assessment: Location: Right, Left Head(facial, back of neck temporal) Radiating: neck Onset: More than a month ago Duration: Chronic pain Quality: Nagging Severity: 2 /10 (self-reported pain score)  Note: Reported level is compatible with observation.                         When using our objective Pain Scale, levels between 6 and 10/10 are said to belong in an emergency room, as it progressively worsens from a 6/10, described as severely limiting, requiring emergency care not usually available at an outpatient pain management facility. At a 6/10 level, communication becomes difficult and requires great effort. Assistance to reach the emergency department may be required. Facial flushing and profuse sweating along with potentially dangerous increases in heart rate and blood pressure will be evident. Effect on ADL:   Timing: Intermittent Modifying factors: medications  Onset and Duration: Present longer than 3 months Cause of pain: Unknown Severity: No change since onset, NAS-11 at its worse: 10/10 and NAS-11 on the average:  7/10 Timing: Not influenced by the time of the day Aggravating Factors: Prolonged sitting, Prolonged standing and Walking Alleviating Factors: Lying down and Medications Associated Problems: Night-time cramps and Fatigue Quality of Pain: Aching, Disabling, Nagging, Pressure-like and Tingling Previous Examinations or Tests: The patient denies tests. Previous Treatments: Narcotic medications  The patient comes into the clinics today for the first time for a chronic pain management evaluation.   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  Patient endorses headaches approximately 5-6 times a week.  The start in the frontal aspect and radiate to the temporalis region and then to the occiput.  Does have sensitivity to light and sound during severe headaches.  She states that Tylenol helps with her headaches after she stays in a dark room.  Sometimes has nausea from it but only in severe instances.  No tearing of her eye, no redness of the face during the onset of her headaches.  Opioid risk tool (ORT) (Total Score): 0 Opioid Risk Tool - 11/10/17 0954      Family History of Substance Abuse   Alcohol  Negative    Illegal Drugs  Negative    Rx Drugs  Negative      Personal History of Substance Abuse   Alcohol  Negative    Illegal Drugs  Negative    Rx Drugs  Negative      Age   Age between 73-45 years   No      History of Preadolescent Sexual Abuse   History of Preadolescent  Sexual Abuse  Negative or Female      Psychological Disease   Psychological Disease  Negative    Depression  Negative      Total Score   Opioid Risk Tool Scoring  0    Opioid Risk Interpretation  Low Risk      ORT Scoring interpretation table:  Score <3 = Low Risk for SUD  Score between 4-7 = Moderate Risk for SUD  Score >8 = High Risk for Opioid Abuse    Score 20-27 = Severe depression (2.4 times higher risk of SUD and  2.89 times higher risk of overuse)   Pharmacologic Plan: None at this point.            Initial impression: The patient indicated having no interest, at this time.  Opioid therapy not usually effective for migraine treatment or prophylaxis.  Meds   Current Outpatient Medications:  .  acetaminophen (TYLENOL) 325 MG tablet, Take 650 mg by mouth every 6 (six) hours as needed., Disp: , Rfl:  .  calcium acetate, Phos Binder, (PHOSLYRA) 667 MG/5ML SOLN, Take by mouth 3 (three) times daily with meals., Disp: , Rfl:  .  calcium carbonate (TUMS EX) 750 MG chewable tablet, Chew 4 tablets by mouth 3 (three) times daily., Disp: , Rfl:  .  cetirizine (ZYRTEC) 10 MG tablet, Take 10 mg by mouth daily., Disp: , Rfl:  .  fluticasone (FLONASE) 50 MCG/ACT nasal spray, Place into both nostrils daily., Disp: , Rfl:  .  ipratropium (ATROVENT) 0.06 % nasal spray, Place 2 sprays into both nostrils 4 (four) times daily. 3-4 times/ day, Disp: 15 mL, Rfl: 0 .  lidocaine-prilocaine (EMLA) cream, Apply 1 application topically as needed., Disp: , Rfl:  .  nortriptyline (PAMELOR) 10 MG capsule, Take 10 mg by mouth at bedtime., Disp: , Rfl:  .  omeprazole (PRILOSEC) 20 MG capsule, Take 20 mg by mouth daily., Disp: , Rfl:  .  simvastatin (ZOCOR) 10 MG tablet, Take 10 mg by mouth daily., Disp: , Rfl:  .  losartan (COZAAR) 100 MG tablet, Take 100 mg by mouth daily., Disp: , Rfl:  .  magic mouthwash w/lidocaine SOLN, Take 10 mLs by mouth 3 (three) times daily as needed for mouth pain. Swish and spit do not swallow (Patient not taking: Reported on 11/10/2017), Disp: 200 mL, Rfl: 0 .  sevelamer carbonate (RENVELA) 800 MG tablet, Take 800 mg by mouth 3 (three) times daily with meals., Disp: , Rfl:  .  tiZANidine (ZANAFLEX) 4 MG tablet, 4 mg BID prn headaches, muscle spasms, Disp: 60 tablet, Rfl: 1 .  topiramate (TOPAMAX) 25 MG tablet, 25 mg twice daily, extra 25 mg dose after dialysis, Disp: 90 tablet, Rfl: 2     ROS   Cardiovascular History: High blood pressure Pulmonary or Respiratory History: No reported pulmonary signs or symptoms such as wheezing and difficulty taking a deep full breath (Asthma), difficulty blowing air out (Emphysema), coughing up mucus (Bronchitis), persistent dry cough, or temporary stoppage of breathing during sleep Neurological History: No reported neurological signs or symptoms such as seizures, abnormal skin sensations, urinary and/or fecal incontinence, being born with an abnormal open spine and/or a tethered spinal cord Review of Past Neurological Studies: No results found for this or any previous visit. Psychological-Psychiatric History: No reported psychological or psychiatric signs or symptoms such as difficulty sleeping, anxiety, depression, delusions or hallucinations (schizophrenial), mood swings (bipolar disorders) or suicidal ideations or attempts Gastrointestinal History: No reported gastrointestinal signs or  symptoms such as vomiting or evacuating blood, reflux, heartburn, alternating episodes of diarrhea and constipation, inflamed or scarred liver, or pancreas or irrregular and/or infrequent bowel movements Genitourinary History: Difficulty producing urine (Renal failure) Hematological History: Weakness due to low blood hemoglobin or red blood cell count (Anemia) Endocrine History: No reported endocrine signs or symptoms such as high or low blood sugar, rapid heart rate due to high thyroid levels, obesity or weight gain due to slow thyroid or thyroid disease Rheumatologic History: No reported rheumatological signs and symptoms such as fatigue, joint pain, tenderness, swelling, redness, heat, stiffness, decreased range of motion, with or without associated rash Musculoskeletal History: Negative for myasthenia gravis, muscular dystrophy, multiple sclerosis or malignant hyperthermia Work History: Working full time  Allergies  Ms. Hecht is allergic to bee pollen; codeine;  iodine; and shellfish allergy.  Laboratory Chemistry  Inflammation Markers (CRP: Acute Phase) (ESR: Chronic Phase) No results found for: CRP, ESRSEDRATE, LATICACIDVEN               Rheumatology Markers No results found for: RF, ANA, LABURIC, URICUR, LYMEIGGIGMAB, LYMEABIGMQN              Renal Function Markers Lab Results  Component Value Date   BUN 31 (H) 07/05/2014   CREATININE 4.05 (H) 07/05/2014   GFRAA 15 (L) 07/05/2014   GFRNONAA 13 (L) 07/05/2014                 Hepatic Function Markers Lab Results  Component Value Date   AST 18 07/05/2014   ALT 12 (L) 07/05/2014   ALBUMIN 2.6 (L) 07/05/2014   ALKPHOS 68 07/05/2014                 Electrolytes Lab Results  Component Value Date   NA 138 07/05/2014   K 4.7 07/05/2014   CL 110 (H) 07/05/2014   CALCIUM 8.6 07/05/2014   MG 1.7 (L) 07/05/2014   PHOS 3.0 07/05/2014                 Neuropathy Markers No results found for: VITAMINB12, FOLATE, HGBA1C, HIV               Bone Pathology Markers No results found for: VD25OH, [id number], KT6256LS9, [id number], 25OHVITD1, 25OHVITD2, 25OHVITD3, TESTOFREE, TESTOSTERONE               Coagulation Parameters Lab Results  Component Value Date   PLT 141 (L) 07/05/2014                 Cardiovascular Markers Lab Results  Component Value Date   HGB 11.9 (L) 07/05/2014   HCT 36.9 07/05/2014                 CA Markers No results found for: CEA, CA125, LABCA2               Note: Lab results reviewed.  [place]  Drug: Ms. Doshier  reports that she does not use drugs. Alcohol:  reports that she does not drink alcohol. Tobacco:  reports that  has never smoked. she has never used smokeless tobacco. Medical:  has a past medical history of Anemia in chronic kidney disease (CKD), Hypercholesteremia, Hypertension, Kidney failure, Migraine, and Solitary kidney, congenital. Family: family history is not on file.  Past Surgical History:  Procedure Laterality Date  . ARTERIOVENOUS  FISTULA CREATION FEMORAL    . CESAREAN SECTION    . RENAL BIOPSY, PERCUTANEOUS     Active Ambulatory Problems  Diagnosis Date Noted  . Neuralgic migraines 11/10/2017   Resolved Ambulatory Problems    Diagnosis Date Noted  . No Resolved Ambulatory Problems   Past Medical History:  Diagnosis Date  . Anemia in chronic kidney disease (CKD)   . Hypercholesteremia   . Hypertension   . Kidney failure   . Migraine   . Solitary kidney, congenital    Constitutional Exam  General appearance: Well nourished, well developed, and well hydrated. In no apparent acute distress Vitals:   11/10/17 0937  BP: (!) 164/96  Pulse: 79  Resp: 16  Temp: 97.9 F (36.6 C)  TempSrc: Oral  SpO2: 100%  Weight: 149 lb (67.6 kg)  Height: _0  (1.575 m)   BMI Assessment: Estimated body mass index is 27.25 kg/m as calculated from the following:   Height as of this encounter: _1  (1.575 m).   Weight as of this encounter: 149 lb (67.6 kg).  BMI interpretation table: BMI level Category Range association with higher incidence of chronic pain  <18 kg/m2 Underweight   18.5-24.9 kg/m2 Ideal body weight   25-29.9 kg/m2 Overweight Increased incidence by 20%  30-34.9 kg/m2 Obese (Class I) Increased incidence by 68%  35-39.9 kg/m2 Severe obesity (Class II) Increased incidence by 136%  >40 kg/m2 Extreme obesity (Class III) Increased incidence by 254%   BMI Readings from Last 4 Encounters:  11/10/17 27.25 kg/m  05/21/17 27.07 kg/m   Wt Readings from Last 4 Encounters:  11/10/17 149 lb (67.6 kg)  05/21/17 148 lb (67.1 kg)  Psych/Mental status: Alert, oriented x 3 (person, place, & time)       Eyes: PERLA Respiratory: No evidence of acute respiratory distress  Cervical Spine Area Exam  Skin & Axial Inspection: No masses, redness, edema, swelling, or associated skin lesions Alignment: Symmetrical Functional ROM: Unrestricted ROM      Stability: No instability detected Muscle Tone/Strength:  Functionally intact. No obvious neuro-muscular anomalies detected. Sensory (Neurological): Unimpaired Palpation: No palpable anomalies              Upper Extremity (UE) Exam    Side: Right upper extremity  Side: Left upper extremity  Skin & Extremity Inspection: Skin color, temperature, and hair growth are WNL. No peripheral edema or cyanosis. No masses, redness, swelling, asymmetry, or associated skin lesions. No contractures.  Skin & Extremity Inspection: left wrist AV fistula  Functional ROM: Unrestricted ROM          Functional ROM: Unrestricted ROM          Muscle Tone/Strength: Functionally intact. No obvious neuro-muscular anomalies detected.  Muscle Tone/Strength: Functionally intact. No obvious neuro-muscular anomalies detected.  Sensory (Neurological): Unimpaired          Sensory (Neurological): Unimpaired          Palpation: No palpable anomalies              Palpation: No palpable anomalies              Specialized Test(s): Deferred         Specialized Test(s): Deferred          Thoracic Spine Area Exam  Skin & Axial Inspection: No masses, redness, or swelling Alignment: Symmetrical Functional ROM: Unrestricted ROM Stability: No instability detected Muscle Tone/Strength: Functionally intact. No obvious neuro-muscular anomalies detected. Sensory (Neurological): Unimpaired Muscle strength & Tone: No palpable anomalies  Lumbar Spine Area Exam  Skin & Axial Inspection: No masses, redness, or swelling Alignment: Symmetrical Functional ROM: Unrestricted ROM  Stability: No instability detected Muscle Tone/Strength: Functionally intact. No obvious neuro-muscular anomalies detected. Sensory (Neurological): Unimpaired Palpation: No palpable anomalies       Provocative Tests: Lumbar Hyperextension and rotation test: evaluation deferred today       Lumbar Lateral bending test: evaluation deferred today       Patrick's Maneuver: evaluation deferred today                    Gait  & Posture Assessment  Ambulation: Unassisted Gait: Relatively normal for age and body habitus Posture: WNL   Lower Extremity Exam    Side: Right lower extremity  Side: Left lower extremity  Skin & Extremity Inspection: Skin color, temperature, and hair growth are WNL. No peripheral edema or cyanosis. No masses, redness, swelling, asymmetry, or associated skin lesions. No contractures.  Skin & Extremity Inspection: Skin color, temperature, and hair growth are WNL. No peripheral edema or cyanosis. No masses, redness, swelling, asymmetry, or associated skin lesions. No contractures.  Functional ROM: Unrestricted ROM          Functional ROM: Unrestricted ROM          Muscle Tone/Strength: Functionally intact. No obvious neuro-muscular anomalies detected.  Muscle Tone/Strength: Functionally intact. No obvious neuro-muscular anomalies detected.  Sensory (Neurological): Unimpaired  Sensory (Neurological): Unimpaired  Palpation: No palpable anomalies  Palpation: No palpable anomalies   Assessment  Primary Diagnosis & Pertinent Problem List: The primary encounter diagnosis was Migraine without status migrainosus, not intractable, unspecified migraine type. Diagnoses of ESRD on dialysis [facility]) and Chronic pain syndrome were also pertinent to this visit.  Visit Diagnosis (New problems to examiner): 1. Migraine without status migrainosus, not intractable, unspecified migraine type   2. ESRD on dialysis ([place])   3. Chronic pain syndrome    46 year old female with end-stage renal disease secondary to solitary kidney on hemodialysis who presents with chronic headaches approximately 5-6 times a week with associated intermittent nausea, photophobia secondary to migraine.  Patient states that she has tried Excedrin and Tylenol which are fairly effective for headaches.  I discussed migraine triggers with the patient as well as prophylactic therapy for migraines which includes topiramate.  Discussed the risks and  benefits of topiramate.  No history of kidney stones, no history of glaucoma.  Instructed patient to start at 25 mg twice daily with an extra dose after dialysis.  If no side effects at follow-up, will increase dose.  I will also prescribe the patient tizanidine that she can use for her headaches and muscle spasms if there are not responding to ibuprofen or Tylenol.   I also had a discussion about novel therapies available for migraine treatment including calcitonin gene related peptide injections as well as Botox injections but the patient wants to avoid any injections or interventional therapies at this time.  This is reasonable.  Can consider sphenopalatine nerve ganglion block if migraines become more frequent.  Plan: -Continue Excedrin and Tylenol as needed for symptomatic treatment of migraines -Start Topamax 25 mg twice daily, extra dose after dialysis of 25 mg for migraine prevention. -Tizanidine 4 mg twice daily as needed muscle spasms, headaches.  Future: -Fioricet, triptan, sphenopalatine block, calcitonin gene related peptide injection, Botox injection   Pharmacotherapy (current): Medications ordered:  Meds ordered this encounter  Medications  . topiramate (TOPAMAX) 25 MG tablet    Sig: 25 mg twice daily, extra 25 mg dose after dialysis    Dispense:  90 tablet    Refill:  2  .  tiZANidine (ZANAFLEX) 4 MG tablet    Sig: 4 mg BID prn headaches, muscle spasms    Dispense:  60 tablet    Refill:  1   Medications administered during this visit: Zeniah Briney had no medications administered during this visit.    Provider-requested follow-up: Return in about 5 weeks (around 12/15/2017) for Medication Management.  Future Appointments  Date Time Provider [place]  12/20/2017  8:30 AM Gillis Santa, MD [facility] None    Primary Care Physician: Theotis Burrow, MD Location: [facility] Note by: Gillis Santa, M.D, Date: 11/10/2017; Time:  12:51 PM  Patient Instructions  1. Topamax 25 mg twice daily, extra 25 mg dose after dialysis for migraine prevention 2. Tizanidine 4 mg up to twice daily for muscle spasms, headaches

## 2017-12-20 ENCOUNTER — Encounter: Payer: 59 | Admitting: Student in an Organized Health Care Education/Training Program

## 2018-01-23 ENCOUNTER — Other Ambulatory Visit: Payer: Self-pay

## 2018-01-23 ENCOUNTER — Ambulatory Visit
Admission: EM | Admit: 2018-01-23 | Discharge: 2018-01-23 | Disposition: A | Discharge status: Home / Self Care | Payer: 59 | Attending: Family Medicine | Admitting: Family Medicine

## 2018-01-23 ENCOUNTER — Encounter: Payer: Self-pay | Admitting: Emergency Medicine

## 2018-01-23 DIAGNOSIS — H1032 Unspecified acute conjunctivitis, left eye: Secondary | ICD-10-CM | POA: Diagnosis not present

## 2018-01-23 MED ORDER — POLYMYXIN B-TRIMETHOPRIM 10000-0.1 UNIT/ML-% OP SOLN: 1.0000 [drp] | Freq: Four times a day (QID) | OPHTHALMIC | 0 refills | Status: AC

## 2018-01-23 NOTE — ED Triage Notes (Signed)
Patient c/o left eye redness and drainage that started yesterday.

## 2018-01-23 NOTE — ED Provider Notes (Signed)
[facility]    CSN: [id number] Arrival date & time: 01/23/18  1657  History   Chief Complaint Chief Complaint  Patient presents with  . Eye Problem    left   HPI  47 year old female with an extensive PMH presents with the above complaint.  Patient states that she noticed left eye redness and drainage earlier today.  No drainage from the eyes discolored.  No visual disturbance.  No fever.  No chills.  She has had associated sore throat and hoarseness.  No known exacerbating or relieving factors.  No reported recent sick contacts.  No other associated symptoms.  No other complaints at this time.  Past Medical History:  Diagnosis Date  . Anemia in chronic kidney disease (CKD)   . Hypercholesteremia   . Hypertension   . Kidney failure   . Migraine   . Solitary kidney, congenital    Patient Active Problem List   Diagnosis Date Noted  . 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 ARTERIOVENOUS FISTULA CREATION FEMORAL    . CESAREAN SECTION    . RENAL BIOPSY, PERCUTANEOUS    . THYROIDECTOMY, PARTIAL     OB History    No data available     Home Medications    Prior to Admission medications   Medication Sig Start Date End Date Taking? Authorizing Provider  calcium carbonate (TUMS EX) 750 MG chewable tablet Chew 4 tablets by mouth 3 (three) times daily.   Yes [provider]  cetirizine (ZYRTEC) 10 MG tablet Take 10 mg by mouth daily.   Yes [provider]  fluticasone (FLONASE) 50 MCG/ACT nasal spray Place into both nostrils daily.   Yes [provider]  nortriptyline (PAMELOR) 10 MG capsule Take 10 mg by mouth at bedtime.   Yes [provider]  omeprazole (PRILOSEC) 20 MG capsule Take 20 mg by mouth daily.   Yes [provider]  sevelamer carbonate (RENVELA) 800 MG tablet Take 800 mg by mouth 3 (three) times daily with meals.   Yes [provider]  simvastatin  (ZOCOR) 10 MG tablet Take 10 mg by mouth daily.   Yes [provider]  acetaminophen (TYLENOL) 325 MG tablet Take 650 mg by mouth every 6 (six) hours as needed.    [provider]  calcium acetate, Phos Binder, (PHOSLYRA) 667 MG/5ML SOLN Take by mouth 3 (three) times daily with meals.    [provider]  ipratropium (ATROVENT) 0.06 % nasal spray Place 2 sprays into both nostrils 4 (four) times daily. 3-4 times/ day 05/21/17   Melynda Ripple, MD  trimethoprim-polymyxin b [facility]) ophthalmic solution Place 1 drop into the left eye every 6 (six) hours for 5 days. 01/23/18 01/28/18  Coral Spikes, DO   Family History History reviewed. No pertinent family history.  Social History Social History   Tobacco Use  . Smoking status: Never Smoker  . Smokeless tobacco: Never Used  Substance Use Topics  . Alcohol use: No  . Drug use: No     Allergies   Bee pollen; Codeine; Iodine; and Shellfish allergy   Review of Systems Review of Systems  Constitutional: Negative.   HENT: Positive for sore throat and voice change.   Eyes: Positive for discharge and redness.   Physical Exam Triage Vital Signs ED Triage Vitals  Enc Vitals Group     BP 01/23/18 1712 (!) 146/90     Pulse Rate 01/23/18 1712  100     Resp 01/23/18 1712 16     Temp 01/23/18 1712 98.6 F (37 C)     Temp Source 01/23/18 1712 Oral     SpO2 01/23/18 1712 98 %     Weight 01/23/18 1707 147 lb (66.7 kg)     Height 01/23/18 1707 5\' 2"  (1.575 m)     Head Circumference --      Peak Flow --      Pain Score 01/23/18 1707 3     Pain Loc --      Pain Edu? --      Excl. in [place]? --    Updated Vital Signs BP (!) 146/90 (BP Location: Right Arm)   Pulse 100   Temp 98.6 F (37 C) (Oral)   Resp 16   Ht 5\' 2"  (1.575 m)   Wt 147 lb (66.7 kg)   LMP 01/09/2018 (Approximate)   SpO2 98%   BMI 26.89 kg/m   Visual Acuity Right Eye Distance: 20/25 corrected Left Eye Distance: 20/25 corrected Bilateral  Distance:    Right Eye Near:   Left Eye Near:    Bilateral Near:     Physical Exam  Constitutional: She is oriented to person, place, and time. She appears well-developed and well-nourished. No distress.  HENT:  Head: Normocephalic and atraumatic.  Eyes: Pupils are equal, round, and reactive to light.  Left eye with conjunctival injection.  Purulent discharge noted at the medial canthus.  Cardiovascular: Normal rate and regular rhythm.  Pulmonary/Chest: Effort normal. She has no wheezes. She has no rales.  Neurological: She is alert and oriented to person, place, and time.  Psychiatric: She has a normal mood and affect. Her behavior is normal.  Nursing note and vitals reviewed.  UC Treatments / Results  Labs (all labs ordered are listed, but only abnormal results are displayed) Labs Reviewed - No data to display  EKG  EKG Interpretation None       Radiology No results found.  Procedures Procedures (including critical care time)  Medications Ordered in UC Medications - No data to display   Initial Impression / Assessment and Plan / UC Course  I have reviewed the triage vital signs and the nursing notes.  Pertinent labs & imaging results that were available during my care of the patient were reviewed by me and considered in my medical decision making (see chart for details).     47 year old female presents with conjunctivitis.  Treating with Polytrim.  Final Clinical Impressions(s) / UC Diagnoses   Final diagnoses:  Acute bacterial conjunctivitis of left eye    ED Discharge Orders        Ordered    trimethoprim-polymyxin b (POLYTRIM) ophthalmic solution  Every 6 hours     01/23/18 1730     Controlled Substance Prescriptions [place] Controlled Substance Registry consulted? Not Applicable   Coral Spikes, DO 01/23/18 1746

## 2018-11-18 ENCOUNTER — Encounter: Payer: Self-pay | Admitting: Emergency Medicine

## 2018-11-18 ENCOUNTER — Emergency Department: Payer: 59

## 2018-11-18 ENCOUNTER — Emergency Department
Admission: EM | Admit: 2018-11-18 | Discharge: 2018-11-18 | Disposition: A | Discharge status: Home / Self Care | Payer: 59 | Attending: Emergency Medicine | Admitting: Emergency Medicine

## 2018-11-18 ENCOUNTER — Other Ambulatory Visit: Payer: Self-pay

## 2018-11-18 DIAGNOSIS — I471 Supraventricular tachycardia: Secondary | ICD-10-CM | POA: Diagnosis not present

## 2018-11-18 DIAGNOSIS — Z79899 Other long term (current) drug therapy: Secondary | ICD-10-CM | POA: Diagnosis not present

## 2018-11-18 DIAGNOSIS — R000 Tachycardia, unspecified: Secondary | ICD-10-CM | POA: Diagnosis present

## 2018-11-18 DIAGNOSIS — Z992 Dependence on renal dialysis: Secondary | ICD-10-CM | POA: Diagnosis not present

## 2018-11-18 DIAGNOSIS — I120 Hypertensive chronic kidney disease with stage 5 chronic kidney disease or end stage renal disease: Secondary | ICD-10-CM | POA: Insufficient documentation

## 2018-11-18 DIAGNOSIS — N186 End stage renal disease: Secondary | ICD-10-CM | POA: Diagnosis not present

## 2018-11-18 LAB — BASIC METABOLIC PANEL
Anion gap: 13 (ref 5–15)
BUN: 42 mg/dL — ABNORMAL HIGH (ref 6–20)
CALCIUM: 7.8 mg/dL — AB (ref 8.9–10.3)
CO2: 24 mmol/L (ref 22–32)
Chloride: 102 mmol/L (ref 98–111)
Creatinine, Ser: 12.21 mg/dL — ABNORMAL HIGH (ref 0.44–1.00)
GFR calc non Af Amer: 3 mL/min — ABNORMAL LOW (ref >60)
GFR, EST AFRICAN AMERICAN: 4 mL/min — AB (ref >60)
Glucose, Bld: 91 mg/dL (ref 70–99)
POTASSIUM: 5.1 mmol/L (ref 3.5–5.1)
SODIUM: 139 mmol/L (ref 135–145)

## 2018-11-18 LAB — CBC WITH DIFFERENTIAL/PLATELET
ABS IMMATURE GRANULOCYTES: 0.03 10*3/uL (ref 0.00–0.07)
BASOS ABS: 0 10*3/uL (ref 0.0–0.1)
BASOS PCT: 1 %
EOS ABS: 0.1 10*3/uL (ref 0.0–0.5)
Eosinophils Relative: 2 %
HCT: 39.4 % (ref 36.0–46.0)
Hemoglobin: 11.9 g/dL — ABNORMAL LOW (ref 12.0–15.0)
IMMATURE GRANULOCYTES: 0 %
Lymphocytes Relative: 42 %
Lymphs Abs: 3.3 10*3/uL (ref 0.7–4.0)
MCH: 29.9 pg (ref 26.0–34.0)
MCHC: 30.2 g/dL (ref 30.0–36.0)
MCV: 99 fL (ref 80.0–100.0)
MONOS PCT: 7 %
Monocytes Absolute: 0.5 10*3/uL (ref 0.1–1.0)
NEUTROS ABS: 3.8 10*3/uL (ref 1.7–7.7)
NEUTROS PCT: 48 %
NRBC: 0 % (ref 0.0–0.2)
PLATELETS: 186 10*3/uL (ref 150–400)
RBC: 3.98 MIL/uL (ref 3.87–5.11)
RDW: 16 % — AB (ref 11.5–15.5)
WBC: 7.8 10*3/uL (ref 4.0–10.5)

## 2018-11-18 LAB — TROPONIN I

## 2018-11-18 MED ORDER — ADENOSINE 6 MG/2ML IV SOLN
INTRAVENOUS | Status: AC
  Filled 2018-11-18: qty 2

## 2018-11-18 MED ORDER — SODIUM CHLORIDE 0.9 % IV SOLN
1.0000 g | Freq: Once | INTRAVENOUS | Status: AC
  Administered 2018-11-18: 1 g via INTRAVENOUS
  Filled 2018-11-18: qty 10

## 2018-11-18 MED ORDER — SODIUM CHLORIDE 0.9 % IV SOLN
1.0000 g | Freq: Once | INTRAVENOUS | Status: DC
  Filled 2018-11-18: qty 10

## 2018-11-18 MED ORDER — ADENOSINE 12 MG/4ML IV SOLN
INTRAVENOUS | Status: DC
Start: 2018-11-18 — End: 2018-11-19
  Filled 2018-11-18: qty 8

## 2018-11-18 MED ORDER — ADENOSINE 6 MG/2ML IV SOLN
6.0000 mg | Freq: Once | INTRAVENOUS | Status: AC
  Administered 2018-11-18: 6 mg via INTRAVENOUS

## 2018-11-18 NOTE — ED Notes (Signed)
In triage 1 for EKG. HR 170s with dizziness and mild SHOB. Taking to room.  Given syringe and instructed to attempt to blow plunger for vagal attempt.

## 2018-11-18 NOTE — ED Triage Notes (Signed)
Pt reports rapid heart rate.

## 2018-11-18 NOTE — ED Provider Notes (Addendum)
[facility] Emergency Department Provider Note ____________________________________________   First MD Initiated Contact with Patient 11/18/18 1846     (approximate)  I have reviewed the triage vital signs and the nursing notes.   HISTORY  Chief Complaint Tachycardia    HPI Helen Gordon is a 47 y.o. female with PMH as noted below including kidney failure on peritoneal dialysis who presents with dizziness, acute onset around 2 PM today, associated with mild shortness of breath and with palpitations.  She denies chest pain.  She states that she has had a similar episode in the past when her calcium was too low.  Past Medical History:  Diagnosis Date  . Anemia in chronic kidney disease (CKD)   . Hypercholesteremia   . Hypertension   . Kidney failure   . Migraine   . Solitary kidney, congenital     Patient Active Problem List   Diagnosis Date Noted  . 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 ARTERIOVENOUS FISTULA CREATION FEMORAL    . CESAREAN SECTION    . RENAL BIOPSY, PERCUTANEOUS    . THYROIDECTOMY, PARTIAL      Prior to Admission medications   Medication Sig Start Date End Date Taking? Authorizing Provider  acetaminophen (TYLENOL) 325 MG tablet Take 650 mg by mouth every 6 (six) hours as needed.    [provider]  calcium acetate, Phos Binder, (PHOSLYRA) 667 MG/5ML SOLN Take by mouth 3 (three) times daily with meals.    [provider]  calcium carbonate (TUMS EX) 750 MG chewable tablet Chew 4 tablets by mouth 3 (three) times daily.    [provider]  cetirizine (ZYRTEC) 10 MG tablet Take 10 mg by mouth daily.    [provider]  fluticasone (FLONASE) 50 MCG/ACT nasal spray Place into both nostrils daily.    [provider]  ipratropium (ATROVENT) 0.06 % nasal spray Place 2 sprays into both nostrils 4 (four) times daily. 3-4 times/ day 05/21/17    Melynda Ripple, MD  nortriptyline (PAMELOR) 10 MG capsule Take 10 mg by mouth at bedtime.    [provider]  omeprazole (PRILOSEC) 20 MG capsule Take 20 mg by mouth daily.    [provider]  sevelamer carbonate (RENVELA) 800 MG tablet Take 800 mg by mouth 3 (three) times daily with meals.    [provider]  simvastatin (ZOCOR) 10 MG tablet Take 10 mg by mouth daily.    [provider]    Allergies Bee pollen; Codeine; Iodine; and Shellfish allergy  History reviewed. No pertinent family history.  Social History Social History   Tobacco Use  . Smoking status: Never Smoker  . Smokeless tobacco: Never Used  Substance Use Topics  . Alcohol use: No  . Drug use: No    Review of Systems  Constitutional: No fever. Eyes: No redness. ENT: No sore throat. Cardiovascular: Denies chest pain. Respiratory: Positive for shortness of breath. Gastrointestinal: No vomiting or diarrhea Genitourinary: Negative for flank pain.  Musculoskeletal: Negative for back pain. Skin: Negative for rash. Neurological: Negative for headache.   ____________________________________________   PHYSICAL EXAM:  VITAL SIGNS: ED Triage Vitals  Enc Vitals Group     BP 11/18/18 1839 (!) 157/101     Pulse Rate 11/18/18 1932 (!) 106     Resp 11/18/18 1839 18     Temp --      Temp src --  SpO2 11/18/18 1841 99 %     Weight 11/18/18 1841 159 lb 6.3 oz (72.3 kg)     Height 11/18/18 1839 5' (1.524 m)     Head Circumference --      Peak Flow --      Pain Score 11/18/18 1839 0     Pain Loc --      Pain Edu? --      Excl. in [place]? --     Constitutional: Alert and oriented.  Slightly anxious appearing but in no acute distress. Eyes: Conjunctivae are normal.  Head: Atraumatic. Nose: No congestion/rhinnorhea. Mouth/Throat: Mucous membranes are somewhat dry.   Neck: Normal range of motion.  Cardiovascular: Tachycardic, regular rhythm. Grossly normal heart sounds.   Good peripheral circulation. Respiratory: Normal respiratory effort.  No retractions. Lungs CTAB. Gastrointestinal:  No distention.  Musculoskeletal: No lower extremity edema.  Extremities warm and well perfused.  Neurologic:  Normal speech and language. No gross focal neurologic deficits are appreciated.  Skin:  Skin is warm and dry. No rash noted. Psychiatric: Mood and affect are normal. Speech and behavior are normal.  ____________________________________________   LABS (all labs ordered are listed, but only abnormal results are displayed)  Labs Reviewed  BASIC METABOLIC PANEL - Abnormal; Notable for the following components:      Result Value   BUN 42 (*)    Creatinine, Ser 12.21 (*)    Calcium 7.8 (*)    GFR calc non Af Amer 3 (*)    GFR calc Af Amer 4 (*)    All other components within normal limits  CBC WITH DIFFERENTIAL/PLATELET - Abnormal; Notable for the following components:   Hemoglobin 11.9 (*)    RDW 16.0 (*)    All other components within normal limits  TROPONIN I   ____________________________________________  EKG  ED ECG REPORT I, Arta Silence, the attending physician, personally viewed and interpreted this ECG.  Date: 11/18/2018 EKG Time: 1832 Rate: 173 Rhythm: SVT QRS Axis: normal Intervals: normal ST/T Wave abnormalities: Nonspecific abnormalities likely rate related Narrative Interpretation: SVT with no evidence of acute ischemia  ED ECG REPORT I, Arta Silence, the attending physician, personally viewed and interpreted this ECG.  Date: 11/18/2018 EKG Time: 1853 Rate: 112 Rhythm: Sinus tachycardia QRS Axis: normal Intervals: Borderline prolonged QT ST/T Wave abnormalities: normal Narrative Interpretation: no evidence of acute ischemia   ____________________________________________  RADIOLOGY  CXR: No focal infiltrate.  Abnormal soft tissue at the right hilum, possible  adenopathy.  ____________________________________________   PROCEDURES  Procedure(s) performed: No  Procedures  Critical Care performed: Yes  CRITICAL CARE Performed by: Arta Silence   Total critical care time: 30 minutes  Critical care time was exclusive of separately billable procedures and treating other patients.  Critical care was necessary to treat or prevent imminent or life-threatening deterioration.  Critical care was time spent personally by me on the following activities: development of treatment plan with patient and/or surrogate as well as nursing, discussions with consultants, evaluation of patient's response to treatment, examination of patient, obtaining history from patient or surrogate, ordering and performing treatments and interventions, ordering and review of laboratory studies, ordering and review of radiographic studies, pulse oximetry and re-evaluation of patient's condition. ____________________________________________   INITIAL IMPRESSION / ASSESSMENT AND PLAN / ED COURSE  Pertinent labs & imaging results that were available during my care of the patient were reviewed by me and considered in my medical decision making (see chart for details).  47 year old female with  PMH as noted above including ESRD on peritoneal dialysis as well as hypoparathyroidism and prior hypocalcemia presents with dizziness and some shortness of breath.  She was found to be tachycardic to the 170s and in SVT.  She reports a prior history of a similar episode although she is not completely sure if it was the same thing.  On exam, the patient is relatively well-appearing.  Other than the high heart rate her vital signs are normal.  The remainder of the exam is as described above.  EKG confirms SVT.  The patient was placed on the monitor and given IV adenosine.  She immediately returned to sinus rhythm and her symptoms improved.  We will obtain lab work-up to evaluate for any  precipitating causes and evaluate for hypocalcemia.  If the work-up is negative and the patient remains in sinus rhythm I anticipate discharge home.  ----------------------------------------- 8:45 PM on 11/18/2018 -----------------------------------------  The patient has been asymptomatic since getting the adenosine and is comfortable at this time.  Her heart rate has remained around 100 and she is in sinus rhythm.  The lab work-up reveals hypocalcemia.  The patient has a history of this although past labs from several years ago show that it was normal.  She is on oral calcium at home.  I suspect that this is likely chronic.  Although I think it is unlikely that the hypocalcemia precipitated the SVT, she does have a borderline prolonged QT on her EKG.  I will give a dose of IV calcium in the ED.  I offered admission for monitoring of her heart rate to make sure she does not go back into the SVT, and to make sure that her calcium improves.  The patient is adamant that she does not want to stay in the hospital.  Given that her vital signs are now normal and she is totally asymptomatic I think that this is reasonable.  We will give a dose of IV calcium and if the patient has no recurrent symptoms she will go home.  I am signing the patient out to oncoming physician Dr. Corky Downs.  I gave the patient verbal return precautions and she expressed understanding.  She agrees to follow-up with her doctor this week and already has an appointment arranged.  Also of note, chest x-ray showed some nonspecific opacities, possible hilar adenopathy.  The patient has no respiratory symptoms and there is no evidence of pneumonia or other acute etiology.  Radiology recommends CT chest with contrast although there is no indication that this needs to be done emergently today.  I discussed this finding with the patient.  Since she is following up with her primary care doctor in several days, this can be ordered as an  outpatient.  I will note this in the discharge instructions. ____________________________________________   FINAL CLINICAL IMPRESSION(S) / ED DIAGNOSES  Final diagnoses:  SVT (supraventricular tachycardia) ([place])  Hypocalcemia      NEW MEDICATIONS STARTED DURING THIS VISIT:  Discharge Medication List as of 11/18/2018 10:43 PM       Note:  This document was prepared using Dragon voice recognition software and may include unintentional dictation errors.    Arta Silence, MD 11/18/18 6720    Arta Silence, MD 11/22/18 1104

## 2018-11-18 NOTE — Discharge Instructions (Addendum)
Continue your calcium supplement as prescribed.  Follow-up with your doctor this week as scheduled.  Return to the ER for new, worsening, persistent dizziness, shortness of breath, palpitations, any other new or worsening symptoms that concern you, or if you note that your heart rate is elevated above 120.  Your chest x-ray showed some areas in the right chest which could be lymph nodes.  It is recommended that you get a CT scan of your chest with IV contrast.  You should discuss this with your doctor when you follow-up this week and it can be ordered as an outpatient.

## 2019-07-18 ENCOUNTER — Encounter: Payer: Self-pay | Admitting: Emergency Medicine

## 2019-07-18 ENCOUNTER — Emergency Department
Admission: EM | Admit: 2019-07-18 | Discharge: 2019-07-18 | Disposition: A | Discharge status: Home / Self Care | Payer: Medicare Other | Attending: Emergency Medicine | Admitting: Emergency Medicine

## 2019-07-18 ENCOUNTER — Other Ambulatory Visit: Payer: Self-pay

## 2019-07-18 ENCOUNTER — Emergency Department: Payer: Medicare Other

## 2019-07-18 DIAGNOSIS — Z940 Kidney transplant status: Secondary | ICD-10-CM | POA: Insufficient documentation

## 2019-07-18 DIAGNOSIS — Z20828 Contact with and (suspected) exposure to other viral communicable diseases: Secondary | ICD-10-CM | POA: Insufficient documentation

## 2019-07-18 DIAGNOSIS — R05 Cough: Secondary | ICD-10-CM | POA: Diagnosis not present

## 2019-07-18 DIAGNOSIS — R059 Cough, unspecified: Secondary | ICD-10-CM

## 2019-07-18 DIAGNOSIS — Z79899 Other long term (current) drug therapy: Secondary | ICD-10-CM | POA: Diagnosis not present

## 2019-07-18 DIAGNOSIS — N186 End stage renal disease: Secondary | ICD-10-CM | POA: Insufficient documentation

## 2019-07-18 DIAGNOSIS — E782 Mixed hyperlipidemia: Secondary | ICD-10-CM | POA: Diagnosis not present

## 2019-07-18 DIAGNOSIS — I120 Hypertensive chronic kidney disease with stage 5 chronic kidney disease or end stage renal disease: Secondary | ICD-10-CM | POA: Insufficient documentation

## 2019-07-18 LAB — SARS CORONAVIRUS 2 BY RT PCR (HOSPITAL ORDER, PERFORMED IN ~~LOC~~ HOSPITAL LAB): SARS Coronavirus 2: NEGATIVE

## 2019-07-18 MED ORDER — ALBUTEROL SULFATE HFA 108 (90 BASE) MCG/ACT IN AERS: 2.0000 | INHALATION_SPRAY | Freq: Four times a day (QID) | RESPIRATORY_TRACT | 0 refills | Status: DC | PRN

## 2019-07-18 MED ORDER — FLUTICASONE PROPIONATE 50 MCG/ACT NA SUSP: 1.0000 | Freq: Every day | NASAL | 0 refills | Status: DC

## 2019-07-18 NOTE — ED Provider Notes (Signed)
[facility] Emergency Department Provider Note  ____________________________________________  Time seen: Approximately 3:52 PM  I have reviewed the triage vital signs and the nursing notes.   HISTORY  Chief Complaint Cough    HPI Helen Gordon is a 48 y.o. female with a history of recent kidney transplant and hypertension, presents to the emergency department with 2 days of nonproductive cough and concern for wheezing that started last night.  Patient had kidney transplant in December 2019 and has recovered without complication.  Patient had basic labs obtained in July which revealed normal kidney function.  Patient does not want basic labs obtained in the emergency department today stating that she has labs scheduled later in the month.  She denies dysuria, hematuria, increased urinary frequency or low back pain.  No nausea, vomiting or abdominal pain.  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  She also has elderly family members in her home and young children.  Patient states that her kidney transplant team was concerned for COVID-19 she is here for testing.  She denies fever and chills at home.  No rash.  No other alleviating measures have been attempted.        Past Medical History:  Diagnosis Date  . Anemia in chronic kidney disease (CKD)   . Hypercholesteremia   . Hypertension   . Kidney failure   . Migraine   . Solitary kidney, congenital     Patient Active Problem List   Diagnosis Date Noted  . 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 ARTERIOVENOUS FISTULA CREATION FEMORAL    . CESAREAN SECTION    . RENAL BIOPSY, PERCUTANEOUS    . THYROIDECTOMY, PARTIAL      Prior to Admission medications   Medication Sig Start Date End Date Taking? Authorizing Provider  acetaminophen (TYLENOL) 325 MG tablet Take 650 mg by mouth every 6 (six) hours as needed.    [provider]  albuterol (VENTOLIN HFA) 108 (90 Base) MCG/ACT inhaler Inhale 2 puffs into the lungs every 6 (six) hours as needed for wheezing or shortness of breath. 07/18/19   Lannie Fields, PA-C  calcium acetate, Phos Binder, (PHOSLYRA) 667 MG/5ML SOLN Take by mouth 3 (three) times daily with meals.    [provider]  calcium carbonate (TUMS EX) 750 MG chewable tablet Chew 4 tablets by mouth 3 (three) times daily.    [provider]  cetirizine (ZYRTEC) 10 MG tablet Take 10 mg by mouth daily.    [provider]  fluticasone (FLONASE) 50 MCG/ACT nasal spray Place 1 spray into both nostrils daily for 7 days. 07/18/19 07/25/19  Lannie Fields, PA-C  ipratropium (ATROVENT) 0.06 % nasal spray Place 2 sprays into both nostrils 4 (four) times daily. 3-4 times/ day 05/21/17   Melynda Ripple, MD  nortriptyline (PAMELOR) 10 MG capsule Take 10 mg by mouth at bedtime.    [provider]  omeprazole (PRILOSEC) 20 MG capsule Take 20 mg by mouth daily.    [provider]  sevelamer carbonate (RENVELA) 800 MG tablet Take 800 mg by mouth 3 (three) times daily with meals.    [provider]  simvastatin (ZOCOR) 10 MG tablet Take 10 mg by mouth daily.    [provider]    Allergies Bee pollen, Codeine, Iodine, and Shellfish allergy  No family history on file.  Social History Social History  Tobacco Use  . Smoking status: Never Smoker  . Smokeless tobacco: Never Used  Substance Use Topics  . Alcohol use: No  . Drug use: No     Review of Systems  Constitutional: No fever/chills Eyes: No visual changes. No discharge ENT: No upper respiratory complaints. Cardiovascular: no chest pain. Respiratory: Patient has non-productive cough.  Gastrointestinal: No abdominal pain.  No nausea, no vomiting.  No diarrhea.  No constipation. Genitourinary: Negative for dysuria. No hematuria Musculoskeletal: Negative for musculoskeletal pain. Skin:  Negative for rash, abrasions, lacerations, ecchymosis. Neurological: Negative for headaches, focal weakness or numbness.   ____________________________________________   PHYSICAL EXAM:  VITAL SIGNS: ED Triage Vitals  Enc Vitals Group     BP 07/18/19 1458 119/84     Pulse Rate 07/18/19 1458 (!) 109     Resp --      Temp 07/18/19 1458 98.2 F (36.8 C)     Temp Source 07/18/19 1458 Oral     SpO2 07/18/19 1458 100 %     Weight 07/18/19 1458 195 lb 8.8 oz (88.7 kg)     Height 07/18/19 1458 5\' 2"  (1.575 m)     Head Circumference --      Peak Flow --      Pain Score 07/18/19 1505 0     Pain Loc --      Pain Edu? --      Excl. in [place]? --      Constitutional: Alert and oriented. Well appearing and in no acute distress. Eyes: Conjunctivae are normal. PERRL. EOMI. Head: Atraumatic. ENT:      Ears: TMs are mildly infected.       Nose: No congestion/rhinnorhea.      Mouth/Throat: Mucous membranes are moist.  Tongue appears red from recent candy consumption.  Posterior pharynx is nonerythematous.  Uvula is midline. Neck: No stridor.  No cervical spine tenderness to palpation. Hematological/Lymphatic/Immunilogical: No cervical lymphadenopathy.  Cardiovascular: Normal rate, regular rhythm. Normal S1 and S2.  Good peripheral circulation. Respiratory: Normal respiratory effort without tachypnea or retractions. Lungs CTAB. Good air entry to the bases with no decreased or absent breath sounds. Gastrointestinal: Bowel sounds 4 quadrants. Soft and nontender to palpation. No guarding or rigidity. No palpable masses. No distention. No CVA tenderness. Musculoskeletal: Full range of motion to all extremities. No gross deformities appreciated. Neurologic:  Normal speech and language. No gross focal neurologic deficits are appreciated.  Skin:  Skin is warm, dry and intact. No rash noted. Psychiatric: Mood and affect are normal. Speech and behavior are normal. Patient exhibits appropriate insight and  judgement.   ____________________________________________   LABS (all labs ordered are listed, but only abnormal results are displayed)  Labs Reviewed  SARS CORONAVIRUS 2 (HOSPITAL ORDER, [place] [facility])   ____________________________________________  EKG   ____________________________________________  RADIOLOGY  I personally viewed and evaluated these images as part of my medical decision making, as well as reviewing the written report by the radiologist.   Dg Chest 1 View  Result Date: 07/18/2019 CLINICAL DATA:  Cough for 2 days. History of kidney transplant. Clinical concern for COVID-19 pneumonia. EXAM: CHEST  1 VIEW COMPARISON:  11/18/2018 chest radiograph. FINDINGS: Stable cardiomediastinal silhouette with normal heart size. No pneumothorax. No pleural effusion. Lungs appear clear, with no acute consolidative airspace disease and no pulmonary edema. IMPRESSION: No active disease. Electronically Signed   By: Ilona Sorrel M.D.   On: 07/18/2019 16:33    ____________________________________________    PROCEDURES  Procedure(s) performed:    Procedures    Medications - No data to display   ____________________________________________   INITIAL IMPRESSION / ASSESSMENT AND PLAN / ED COURSE  Pertinent labs & imaging results that were available during my care of the patient were reviewed by me and considered in my medical decision making (see chart for details).  Review of the [place] CSRS was performed in accordance of the [place] prior to dispensing any controlled drugs.           Assessment and plan Cough 48 year old female presents to the emergency department with nonproductive cough for the past 2 days  Patient was mildly tachycardic at triage.  O2 sats were at 100% and respiratory rate was 12 breaths/min.  Patient had no increased work of breathing or accessory muscle use for respiration.  There were no adventitious lung sounds  auscultated.  Differential diagnosis includes COVID-19, unspecified viral URI, community-acquired pneumonia...  COVID-19 testing was negative in the emergency department.  Chest x-ray revealed no consolidations, opacities or infiltrates that would suggest community-acquired pneumonia.  Patient was given a work note.  She was advised to observe her symptoms at home.  She was given return precautions to return to the emergency department with chest pain, chest tightness or increased work of breathing.  She voiced understanding and stated that she had plans to follow-up with her primary care provider for routine labs planned for August.  All patient questions were answered.    ____________________________________________  FINAL CLINICAL IMPRESSION(S) / ED DIAGNOSES  Final diagnoses:  Cough      NEW MEDICATIONS STARTED DURING THIS VISIT:  ED Discharge Orders         Ordered    fluticasone (FLONASE) 50 MCG/ACT nasal spray  Daily     07/18/19 1733    albuterol (VENTOLIN HFA) 108 (90 Base) MCG/ACT inhaler  Every 6 hours PRN     07/18/19 1733              This chart was dictated using voice recognition software/Dragon. Despite best efforts to proofread, errors can occur which can change the meaning. Any change was purely unintentional.    Lannie Fields, PA-C 07/18/19 1821    Nance Pear, MD 07/20/19 [phone]

## 2019-07-18 NOTE — ED Triage Notes (Signed)
PT c/o cough x2days. Pt has kidney transplant in December and was told to come to ED for covid test. NAD noted. VSS . Denies SOB or fever

## 2020-04-03 ENCOUNTER — Ambulatory Visit
Admission: EM | Admit: 2020-04-03 | Discharge: 2020-04-03 | Disposition: A | Discharge status: Home / Self Care | Payer: 59 | Attending: Family Medicine | Admitting: Family Medicine

## 2020-04-03 ENCOUNTER — Other Ambulatory Visit: Payer: Self-pay

## 2020-04-03 DIAGNOSIS — T50Z95A Adverse effect of other vaccines and biological substances, initial encounter: Secondary | ICD-10-CM | POA: Diagnosis not present

## 2020-04-03 NOTE — ED Triage Notes (Signed)
Pt reports she had Pfizer COVID vaccine 7 days ago. 4 hours later, she felt extremely tired and arm started hurting that night. Pt has been extremely fatigued since getting the vaccine. Pt is feeling better each day but not bouncing back as quickly as she feels she should.

## 2020-04-03 NOTE — ED Provider Notes (Signed)
[facility]    CSN: [id number] Arrival date & time: 04/03/20  1935  History   Chief Complaint Chief Complaint  Patient presents with  . Post Vaccine Complaints    HPI  49 year old female presents with the above complaint.  Patient reports that she got her first dose of the Pfizer Covid vaccine 7 days ago.  She reports that shortly after her vaccine she felt extremely tired and also had arm pain.  She states that she continues to feel fatigued.  She also has intermittent cramps and body aches.  Patient denies any pain at this time.  She states that her pain comes and goes.  Patient concerned about adverse effects from the vaccine given the fact that she is immunocompromised.  No medications were interventions tried.  No relieving factors.  No other complaints.  Past Medical History:  Diagnosis Date  . Anemia in chronic kidney disease (CKD)   . Hypercholesteremia   . Hypertension   . Kidney failure   . Migraine   . Solitary kidney, congenital    Patient Active Problem List   Diagnosis Date Noted  . 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 ARTERIOVENOUS FISTULA CREATION FEMORAL    . CESAREAN SECTION    . RENAL BIOPSY, PERCUTANEOUS    . THYROIDECTOMY, PARTIAL     OB History   No obstetric history on file.    Home Medications    Prior to Admission medications   Medication Sig Start Date End Date Taking? Authorizing Provider  aspirin 81 MG EC tablet Take by mouth. 12/04/19  Yes [provider]  Biotin 5 MG TBDP Take by mouth. 01/31/19  Yes [provider]  mycophenolate (CELLCEPT) 250 MG capsule Take by mouth. 04/04/19  Yes [provider]  predniSONE (DELTASONE) 5 MG tablet Take by mouth. 04/03/19  Yes [provider]  Tacrolimus ER (ENVARSUS XR) 1 MG TB24 Take by mouth. 11/23/19  Yes [provider]  acetaminophen (TYLENOL) 325 MG tablet Take 650 mg by mouth every 6 (six)  hours as needed.    [provider]  albuterol (VENTOLIN HFA) 108 (90 Base) MCG/ACT inhaler Inhale 2 puffs into the lungs every 6 (six) hours as needed for wheezing or shortness of breath. 07/18/19   Lannie Fields, PA-C  calcitRIOL (ROCALTROL) 0.25 MCG capsule  03/18/20   [provider]  calcium acetate, Phos Binder, (PHOSLYRA) 667 MG/5ML SOLN Take by mouth 3 (three) times daily with meals.    [provider]  calcium carbonate (TUMS EX) 750 MG chewable tablet Chew 4 tablets by mouth 3 (three) times daily.    [provider]  cetirizine (ZYRTEC) 10 MG tablet Take 10 mg by mouth daily.    [provider]  fluticasone (FLONASE) 50 MCG/ACT nasal spray Place 1 spray into both nostrils daily for 7 days. 07/18/19 07/25/19  Lannie Fields, PA-C  ipratropium (ATROVENT) 0.06 % nasal spray Place 2 sprays into both nostrils 4 (four) times daily. 3-4 times/ day 05/21/17   Melynda Ripple, MD  nortriptyline (PAMELOR) 10 MG capsule Take 10 mg by mouth at bedtime.    [provider]  omeprazole (PRILOSEC) 20 MG capsule Take 20 mg by mouth daily.    [provider]  sevelamer carbonate (RENVELA) 800 MG tablet Take 800 mg by mouth 3 (three) times daily with meals.    [provider]  simvastatin (ZOCOR) 10 MG tablet Take 10  mg by mouth daily.    [provider]    Family History History reviewed. No pertinent family history.  Social History Social History   Tobacco Use  . Smoking status: Never Smoker  . Smokeless tobacco: Never Used  Substance Use Topics  . Alcohol use: No  . Drug use: No     Allergies   Bee pollen, Morphine, Codeine, Iodine, and Shellfish allergy   Review of Systems Review of Systems  Constitutional: Positive for fatigue. Negative for fever.  Musculoskeletal:       Body aches.   Physical Exam Triage Vital Signs ED Triage Vitals  Enc Vitals Group     BP 04/03/20 1952 111/73     Pulse Rate  04/03/20 1952 85     Resp 04/03/20 1952 16     Temp 04/03/20 1952 97.6 F (36.4 C)     Temp Source 04/03/20 1952 Oral     SpO2 04/03/20 1952 98 %     Weight 04/03/20 1951 197 lb (89.4 kg)     Height 04/03/20 1951 5\' 2"  (1.575 m)     Head Circumference --      Peak Flow --      Pain Score 04/03/20 1951 0     Pain Loc --      Pain Edu? --      Excl. in [place]? --    Updated Vital Signs BP 111/73 (BP Location: Right Arm)   Pulse 85   Temp 97.6 F (36.4 C) (Oral)   Resp 16   Ht 5\' 2"  (1.575 m)   Wt 89.4 kg   LMP 12/03/2019   SpO2 98%   BMI 36.03 kg/m   Visual Acuity Right Eye Distance:   Left Eye Distance:   Bilateral Distance:    Right Eye Near:   Left Eye Near:    Bilateral Near:     Physical Exam Vitals and nursing note reviewed.  Constitutional:      General: She is not in acute distress.    Appearance: Normal appearance. She is not ill-appearing.  HENT:     Head: Normocephalic and atraumatic.  Eyes:     General:        Right eye: No discharge.        Left eye: No discharge.     Conjunctiva/sclera: Conjunctivae normal.  Cardiovascular:     Rate and Rhythm: Normal rate and regular rhythm.  Pulmonary:     Effort: Pulmonary effort is normal.     Breath sounds: Normal breath sounds. No wheezing, rhonchi or rales.  Neurological:     Mental Status: She is alert.  Psychiatric:        Mood and Affect: Mood normal.        Behavior: Behavior normal.    UC Treatments / Results  Labs (all labs ordered are listed, but only abnormal results are displayed) Labs Reviewed - No data to display  EKG   Radiology No results found.  Procedures Procedures (including critical care time)  Medications Ordered in UC Medications - No data to display  Initial Impression / Assessment and Plan / UC Course  I have reviewed the triage vital signs and the nursing notes.  Pertinent labs & imaging results that were available during my care of the patient were reviewed by me  and considered in my medical decision making (see chart for details).    49 year old female presents with adverse effects/side effects from recent Covid vaccine.  She is  well-appearing on exam.  Reassurance provided.  Tylenol as needed for pain.  If she continues to feel poorly, she should contact her transplant team.  Final Clinical Impressions(s) / UC Diagnoses   Final diagnoses:  Adverse effect of vaccine, initial encounter     Discharge Instructions     Rest.  Tylenol as needed.  If you continue to feel poorly, contact your transplant team.  Take care  Dr. Lacinda Axon    ED Prescriptions    None     PDMP not reviewed this encounter.   Coral Spikes, [place] 04/03/20 2049

## 2020-04-03 NOTE — Discharge Instructions (Signed)
Rest.  Tylenol as needed.  If you continue to feel poorly, contact your transplant team.  Take care  Dr. Lacinda Axon

## 2020-04-21 ENCOUNTER — Other Ambulatory Visit: Payer: Self-pay

## 2020-04-21 ENCOUNTER — Ambulatory Visit
Admission: EM | Admit: 2020-04-21 | Discharge: 2020-04-21 | Disposition: A | Discharge status: Home / Self Care | Payer: Medicare Other | Attending: Emergency Medicine | Admitting: Emergency Medicine

## 2020-04-21 DIAGNOSIS — T50Z95A Adverse effect of other vaccines and biological substances, initial encounter: Secondary | ICD-10-CM | POA: Diagnosis not present

## 2020-04-21 DIAGNOSIS — R197 Diarrhea, unspecified: Secondary | ICD-10-CM | POA: Diagnosis not present

## 2020-04-21 DIAGNOSIS — T50B95A Adverse effect of other viral vaccines, initial encounter: Secondary | ICD-10-CM | POA: Insufficient documentation

## 2020-04-21 DIAGNOSIS — R519 Headache, unspecified: Secondary | ICD-10-CM | POA: Insufficient documentation

## 2020-04-21 LAB — BASIC METABOLIC PANEL
Anion gap: 7 (ref 5–15)
BUN: 15 mg/dL (ref 6–20)
CO2: 25 mmol/L (ref 22–32)
Calcium: 9.1 mg/dL (ref 8.9–10.3)
Chloride: 103 mmol/L (ref 98–111)
Creatinine, Ser: 0.99 mg/dL (ref 0.44–1.00)
GFR calc Af Amer: >60 mL/min (ref >60)
GFR calc non Af Amer: >60 mL/min (ref >60)
Glucose, Bld: 112 mg/dL — ABNORMAL HIGH (ref 70–99)
Potassium: 4.4 mmol/L (ref 3.5–5.1)
Sodium: 135 mmol/L (ref 135–145)

## 2020-04-21 NOTE — Discharge Instructions (Addendum)
Push fluids.  Be kind to yourself.  Rest for the next several days.  Reach out to your transplant team if you continue to feel poorly in several days.

## 2020-04-21 NOTE — ED Provider Notes (Signed)
HPI  SUBJECTIVE:  Helen Gordon is a 49 y.o. female who presents with fatigue starting this morning.  She reports right-sided headache, nausea, 3 episodes of diarrhea.  She has nasal congestion, but has had this for some time.  Got a second Covid vaccine 5 days ago.  No urinary complaints, abdominal pain, fevers, loss of sense of smell or taste, sore throat, cough, shortness of breath, vomiting.  No raw or undercooked foods, questionable leftovers.  No contacts with similar illness.  No change in her medications.  No change in urine output.   She has not tried anything for her symptoms.  There are no aggravating or alleviating factors.  Her diarrhea is not associated with eating, drinking.  She states that she had similar symptoms with the first Covid vaccine however it started the day after getting the vaccine.  She has a past medical history of hypertension, she is status post kidney transplant on prednisone, CellCept and tacrolimus.  No history of diabetes.  [id number], Elyse Jarvis, MD  Past Medical History:  Diagnosis Date  . Anemia in chronic kidney disease (CKD)   . Hypercholesteremia   . Hypertension   . Kidney failure   . Migraine   . Solitary kidney, congenital     Past Surgical History:  Procedure Laterality Date  . ARTERIOVENOUS FISTULA CREATION FEMORAL    . CESAREAN SECTION    . RENAL BIOPSY, PERCUTANEOUS    . THYROIDECTOMY, PARTIAL      History reviewed. No pertinent family history.  Social History   Tobacco Use  . Smoking status: Never Smoker  . Smokeless tobacco: Never Used  Substance Use Topics  . Alcohol use: No  . Drug use: No    No current facility-administered medications for this encounter.  Current Outpatient Medications:  .  acetaminophen (TYLENOL) 325 MG tablet, Take 650 mg by mouth every 6 (six) hours as needed., Disp: , Rfl:  .  aspirin 81 MG EC tablet, Take by mouth., Disp: , Rfl:  .  Biotin 5 MG TBDP, Take by mouth., Disp: , Rfl:  .  calcitRIOL  (ROCALTROL) 0.25 MCG capsule, , Disp: , Rfl:  .  ipratropium (ATROVENT) 0.06 % nasal spray, Place 2 sprays into both nostrils 4 (four) times daily. 3-4 times/ day, Disp: 15 mL, Rfl: 0 .  mycophenolate (CELLCEPT) 250 MG capsule, Take by mouth., Disp: , Rfl:  .  nortriptyline (PAMELOR) 10 MG capsule, Take 10 mg by mouth at bedtime., Disp: , Rfl:  .  omeprazole (PRILOSEC) 20 MG capsule, Take 20 mg by mouth daily., Disp: , Rfl:  .  predniSONE (DELTASONE) 5 MG tablet, Take by mouth., Disp: , Rfl:  .  Tacrolimus ER (ENVARSUS XR) 1 MG TB24, Take by mouth., Disp: , Rfl:   Allergies  Allergen Reactions  . Bee Pollen Anaphylaxis  . Morphine Other (See Comments)    Severe vomiting   . Codeine Nausea And Vomiting  . Iodine Swelling  . Shellfish Allergy Swelling     ROS  As noted in HPI.   Physical Exam  BP (!) 129/91 (BP Location: Left Arm)   Pulse 86   Temp 98.1 F (36.7 C) (Oral)   Resp 18   Ht 5\' 2"  (1.575 m)   Wt 91.6 kg   SpO2 98%   BMI 36.95 kg/m   Constitutional: Well developed, well nourished, no acute distress Eyes:  EOMI, conjunctiva normal bilaterally HENT: Normocephalic, atraumatic,mucus membranes moist Respiratory: Normal inspiratory effort, lungs clear bilaterally Cardiovascular:  Normal rate regular rhythm no murmurs rubs or gallop GI: nondistended soft, nontender.  Positive surgical scars over the right lower quadrant umbilicus.  No tenderness over the transplanted kidney in the right lower quadrant.  Suprapubic tenderness. skin: No rash, skin intact Musculoskeletal: no deformities Neurologic: Alert & oriented x 3, no focal neuro deficits Psychiatric: Speech and behavior appropriate   ED Course   Medications - No data to display  Orders Placed This Encounter  Procedures  . Basic metabolic panel    Standing Status:   Standing    Number of Occurrences:   1    Results for orders placed or performed during the hospital encounter of 04/21/20 (from the past 24  hour(s))  Basic metabolic panel     Status: Abnormal   Collection Time: 04/21/20  6:05 PM  Result Value Ref Range   Sodium 135 135 - 145 mmol/L   Potassium 4.4 3.5 - 5.1 mmol/L   Chloride 103 98 - 111 mmol/L   CO2 25 22 - 32 mmol/L   Glucose, Bld 112 (H) 70 - 99 mg/dL   BUN 15 6 - 20 mg/dL   Creatinine, Ser 0.99 0.44 - 1.00 mg/dL   Calcium 9.1 8.9 - 10.3 mg/dL   GFR calc non Af Amer >60 >60 mL/min   GFR calc Af Amer >60 >60 mL/min   Anion gap 7 5 - 15   No results found.  ED Clinical Impression  1. Adverse effect of vaccine, initial encounter      ED Assessment/Plan  Kidney function normal. Suspect adverse reaction to the Covid vaccine.  We'll have her push fluids, rest.  Follow-up with her primary care physician or transplant team if not improving in several days.  Work note for 2 days.  Discussed labs, MDM, treatment plan, and plan for follow-up with patient.  patient agrees with plan.   No orders of the defined types were placed in this encounter.   *This clinic note was created using Dragon dictation software. Therefore, there may be occasional mistakes despite careful proofreading.   ?    Melynda Ripple, MD 04/21/20 [phone]

## 2020-04-21 NOTE — ED Triage Notes (Signed)
Patient reports that she had her 2nd Covid Vaccine on Thursday and she has been tired, fatigued and had diarrhea. Patient states that she is a kidney transplant patient as of 11/2018.

## 2021-05-26 IMAGING — DX CHEST  1 VIEW
1 series · 1 of 1 positions shown · non-contrast
Comparison: 11/18/2018 chest radiograph.

CLINICAL DATA: Cough for 2 days. History of kidney transplant.
Clinical concern for [id number] pneumonia.

EXAM:
CHEST  1 VIEW

[chest ap]
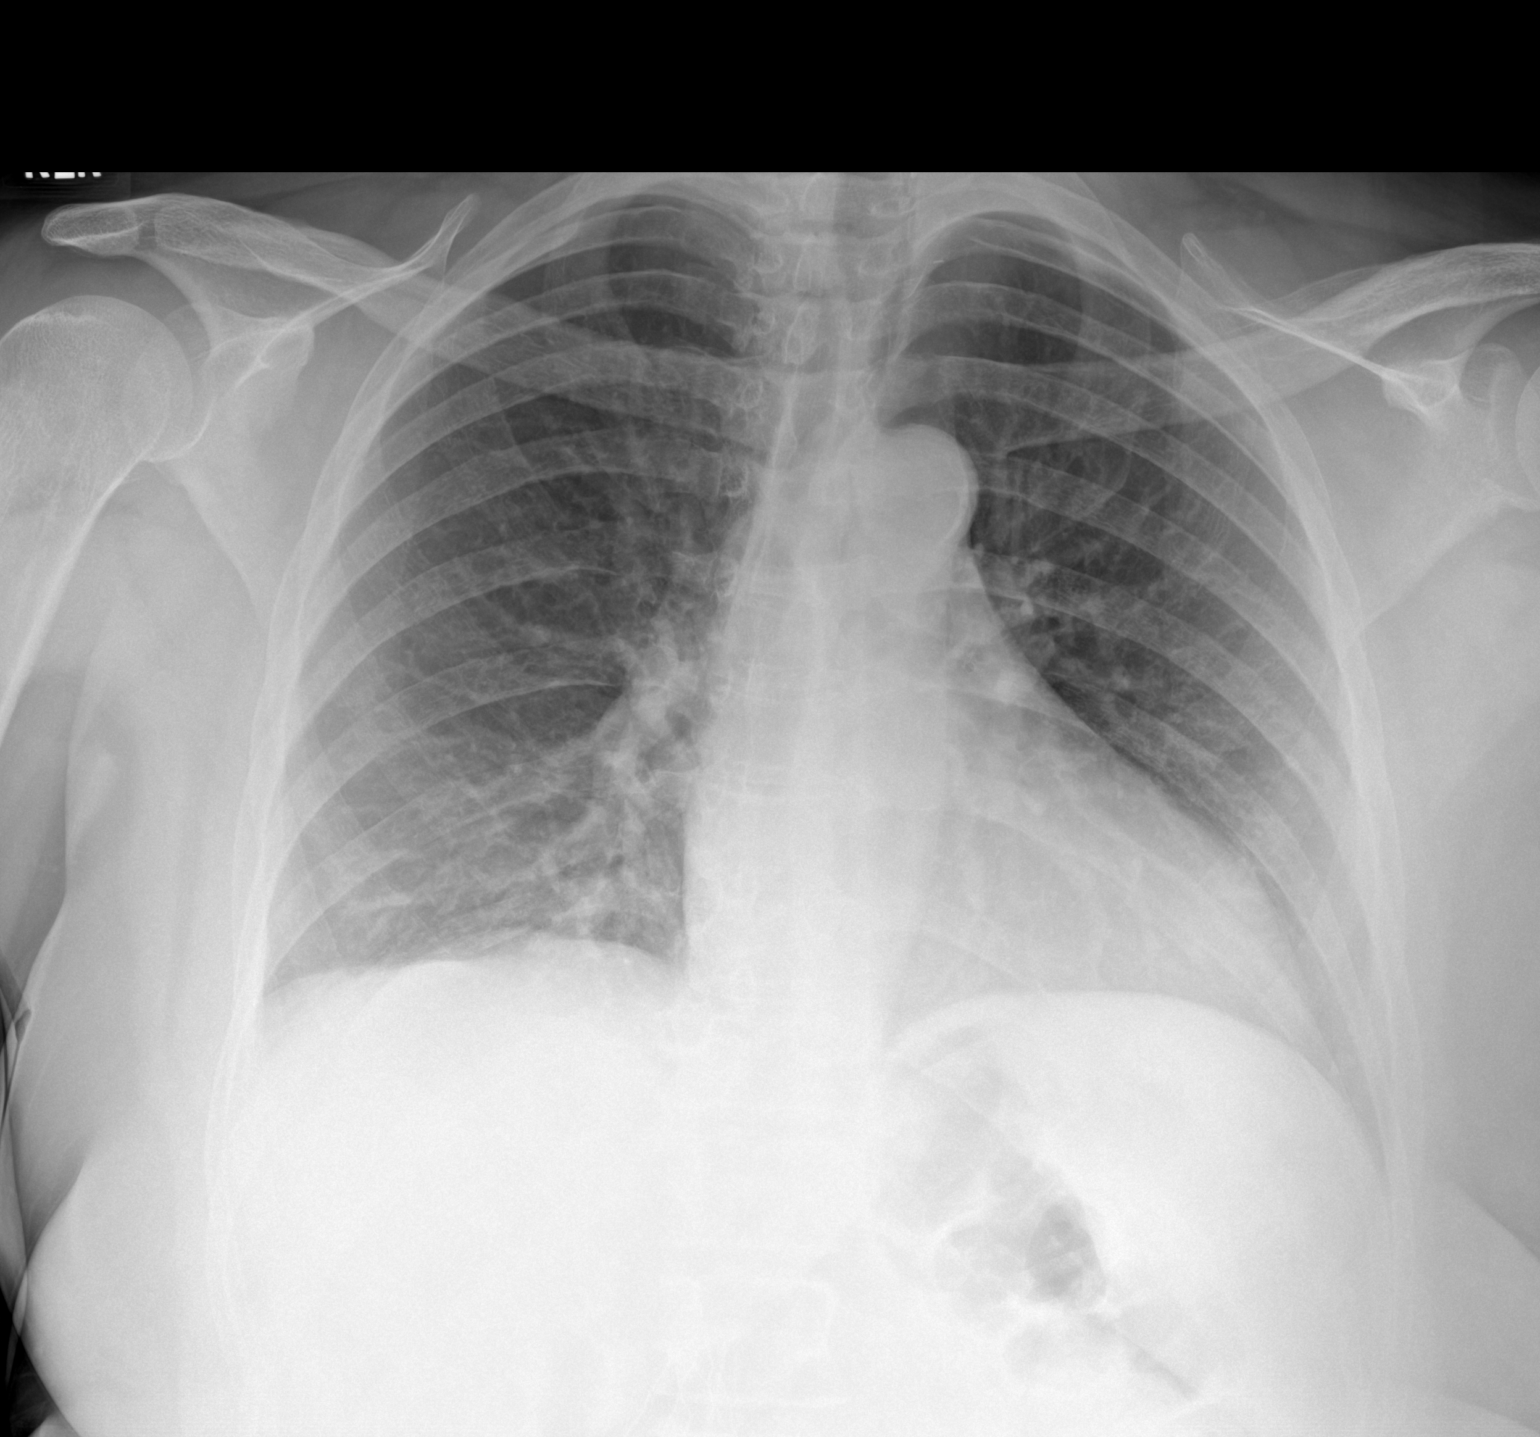

[1 of 1 positions shown; findings below may reference images not displayed]

FINDINGS: Stable cardiomediastinal silhouette with normal heart size. No
pneumothorax. No pleural effusion. Lungs appear clear, with no acute
consolidative airspace disease and no pulmonary edema.
IMPRESSION: No active disease.

## 2022-05-27 ENCOUNTER — Other Ambulatory Visit: Payer: Self-pay

## 2022-05-27 ENCOUNTER — Encounter: Payer: Self-pay | Admitting: Emergency Medicine

## 2022-05-27 ENCOUNTER — Ambulatory Visit
Admission: EM | Admit: 2022-05-27 | Discharge: 2022-05-27 | Disposition: A | Discharge status: Home / Self Care | Payer: Medicare Other | Attending: Emergency Medicine | Admitting: Emergency Medicine

## 2022-05-27 DIAGNOSIS — J069 Acute upper respiratory infection, unspecified: Secondary | ICD-10-CM | POA: Insufficient documentation

## 2022-05-27 LAB — CBC WITH DIFFERENTIAL/PLATELET
Abs Immature Granulocytes: 0.04 10*3/uL (ref 0.00–0.07)
Basophils Absolute: 0 10*3/uL (ref 0.0–0.1)
Basophils Relative: 0 %
Eosinophils Absolute: 0.1 10*3/uL (ref 0.0–0.5)
Eosinophils Relative: 1 %
HCT: 42.3 % (ref 36.0–46.0)
Hemoglobin: 13 g/dL (ref 12.0–15.0)
Immature Granulocytes: 0 %
Lymphocytes Relative: 18 %
Lymphs Abs: 1.6 10*3/uL (ref 0.7–4.0)
MCH: 27.4 pg (ref 26.0–34.0)
MCHC: 30.7 g/dL (ref 30.0–36.0)
MCV: 89.1 fL (ref 80.0–100.0)
Monocytes Absolute: 0.7 10*3/uL (ref 0.1–1.0)
Monocytes Relative: 7 %
Neutro Abs: 6.8 10*3/uL (ref 1.7–7.7)
Neutrophils Relative %: 74 %
Platelets: 221 10*3/uL (ref 150–400)
RBC: 4.75 MIL/uL (ref 3.87–5.11)
RDW: 14.7 % (ref 11.5–15.5)
WBC: 9.3 10*3/uL (ref 4.0–10.5)
nRBC: 0 % (ref 0.0–0.2)

## 2022-05-27 LAB — URINALYSIS, ROUTINE W REFLEX MICROSCOPIC
Bilirubin Urine: NEGATIVE
Glucose, UA: NEGATIVE mg/dL
Hgb urine dipstick: NEGATIVE
Ketones, ur: NEGATIVE mg/dL
Leukocytes,Ua: NEGATIVE
Nitrite: NEGATIVE
Protein, ur: NEGATIVE mg/dL
Specific Gravity, Urine: 1.02 (ref 1.005–1.030)
pH: 6.5 (ref 5.0–8.0)

## 2022-05-27 LAB — COMPREHENSIVE METABOLIC PANEL
ALT: 17 U/L (ref 0–44)
AST: 18 U/L (ref 15–41)
Albumin: 3.4 g/dL — ABNORMAL LOW (ref 3.5–5.0)
Alkaline Phosphatase: 72 U/L (ref 38–126)
Anion gap: 8 (ref 5–15)
BUN: 16 mg/dL (ref 6–20)
CO2: 26 mmol/L (ref 22–32)
Calcium: 9.5 mg/dL (ref 8.9–10.3)
Chloride: 104 mmol/L (ref 98–111)
Creatinine, Ser: 1.02 mg/dL — ABNORMAL HIGH (ref 0.44–1.00)
GFR, Estimated: >60 mL/min (ref >60)
Glucose, Bld: 97 mg/dL (ref 70–99)
Potassium: 3.8 mmol/L (ref 3.5–5.1)
Sodium: 138 mmol/L (ref 135–145)
Total Bilirubin: 0.5 mg/dL (ref 0.3–1.2)
Total Protein: 7.7 g/dL (ref 6.5–8.1)

## 2022-05-27 MED ORDER — IPRATROPIUM BROMIDE 0.06 % NA SOLN: 2.0000 | Freq: Four times a day (QID) | NASAL | 12 refills | Status: AC

## 2022-05-27 MED ORDER — AMOXICILLIN-POT CLAVULANATE 875-125 MG PO TABS: 1.0000 | ORAL_TABLET | Freq: Two times a day (BID) | ORAL | 0 refills | Status: AC

## 2022-05-27 NOTE — ED Provider Notes (Signed)
[facility]    CSN: [id number] Arrival date & time: 05/27/22  1650      History   Chief Complaint Chief Complaint  Patient presents with   Nausea    fatigue - Entered by patient   Fatigue    HPI Helen Gordon is a 51 y.o. female.   HPI  51 year old female here for evaluation of fatigue.  Patient reports that she has been experiencing fatigue for the last 1 and half weeks and nausea for the last week.  She does have some intermittent abdominal pain that comes and goes but is not constant.  She also endorses some urinary frequency but denies any urgency or pain with urination.  She also denies vomiting.  She has had some nasal congestion for 3 weeks and green nasal discharge.  She denies seeing any blood in her urine, having any ear pain, sore throat, or cough.  Patient does have a history of renal transplant and is followed by [facility] transplant surgery.  Her transplant was in December 2019.  She is on tacrolimus for that which does cause her to have diarrhea from time to time.  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      Home Medications    Prior to Admission medications   Medication Sig Start Date End Date Taking? Authorizing Provider  amoxicillin-clavulanate (AUGMENTIN) 875-125 MG tablet Take 1 tablet by mouth every 12 (twelve) hours for 10 days. 05/27/22 06/06/22 Yes Margarette Canada, NP  aspirin 81 MG EC tablet Take by mouth. 12/04/19  Yes [provider]  Biotin 5 MG TBDP Take by mouth. 01/31/19  Yes  [provider]  calcitRIOL (ROCALTROL) 0.25 MCG capsule  03/18/20  Yes [provider]  ipratropium (ATROVENT) 0.06 % nasal spray Place 2 sprays into both nostrils 4 (four) times daily. 05/27/22  Yes Margarette Canada, NP  multivitamin-iron-minerals-folic acid (CENTRUM) chewable tablet Chew 1 tablet by mouth daily.   Yes [provider]  mycophenolate (CELLCEPT) 250 MG capsule Take by mouth. 04/04/19  Yes [provider]  omeprazole (PRILOSEC) 20 MG capsule Take 20 mg by mouth daily.   Yes [provider]  predniSONE (DELTASONE) 5 MG tablet Take by mouth. 04/03/19  Yes [provider]  Tacrolimus ER (ENVARSUS XR) 1 MG TB24 Take by mouth. 11/23/19  Yes [provider]  albuterol (VENTOLIN HFA) 108 (90 Base) MCG/ACT inhaler Inhale 2 puffs into the lungs every 6 (six) hours as needed for wheezing or shortness of breath. 07/18/19 04/21/20  Lannie Fields, PA-C  cetirizine (ZYRTEC) 10 MG tablet Take 10 mg by mouth daily.  04/21/20  [provider]  fluticasone (FLONASE) 50 MCG/ACT nasal spray Place 1 spray into both nostrils daily for 7 days. 07/18/19 04/21/20  Lannie Fields, PA-C  simvastatin (ZOCOR) 10 MG tablet Take 10 mg by mouth daily.  04/21/20  [provider]    Family History History  reviewed. No pertinent family history.  Social History Social History   Tobacco Use   Smoking status: Never   Smokeless tobacco: Never  Vaping Use   Vaping Use: Never used  Substance Use Topics   Alcohol use: No   Drug use: No     Allergies   Bee pollen, Morphine, Codeine, Iodine, and Shellfish allergy   Review of Systems Review of Systems  Constitutional:  Positive for fatigue. Negative for fever.  HENT:  Positive for congestion and rhinorrhea. Negative for ear pain and sore throat.   Respiratory:  Negative for cough.   Gastrointestinal:  Positive for abdominal pain, diarrhea and nausea. Negative for vomiting.   Genitourinary:  Positive for frequency. Negative for dysuria, hematuria and urgency.  Skin:  Negative for rash.  Hematological: Negative.   Psychiatric/Behavioral: Negative.       Physical Exam Triage Vital Signs ED Triage Vitals  Enc Vitals Group     BP 05/27/22 1658 128/85     Pulse Rate 05/27/22 1658 81     Resp 05/27/22 1658 18     Temp 05/27/22 1658 97.8 F (36.6 C)     Temp Source 05/27/22 1658 Oral     SpO2 05/27/22 1658 99 %     Weight --      Height --      Head Circumference --      Peak Flow --      Pain Score 05/27/22 1659 0     Pain Loc --      Pain Edu? --      Excl. in [place]? --    No data found.  Updated Vital Signs BP 128/85 (BP Location: Right Arm)   Pulse 81   Temp 97.8 F (36.6 C) (Oral)   Resp 18   Ht 5\' 1"  (1.549 m)   Wt 208 lb (94.3 kg)   SpO2 99%   BMI 39.30 kg/m   Visual Acuity Right Eye Distance:   Left Eye Distance:   Bilateral Distance:    Right Eye Near:   Left Eye Near:    Bilateral Near:     Physical Exam Vitals and nursing note reviewed.  Constitutional:      Appearance: Normal appearance. She is not ill-appearing.  HENT:     Head: Normocephalic and atraumatic.     Right Ear: Tympanic membrane, ear canal and external ear normal. There is no impacted cerumen.     Left Ear: Tympanic membrane, ear canal and external ear normal. There is no impacted cerumen.     Nose: Congestion and rhinorrhea present.     Mouth/Throat:     Mouth: Mucous membranes are moist.     Pharynx: Oropharynx is clear. Posterior oropharyngeal erythema present.  Cardiovascular:     Rate and Rhythm: Normal rate.     Pulses: Normal pulses.     Heart sounds: Normal heart sounds. No murmur heard.    No friction rub. No gallop.  Pulmonary:     Effort: Pulmonary effort is normal.     Breath sounds: Normal breath sounds. No wheezing, rhonchi or rales.  Musculoskeletal:     Cervical back: Normal range of motion and neck supple.  Lymphadenopathy:      Cervical: No cervical adenopathy.  Skin:    General: Skin is warm and dry.     Capillary Refill: Capillary refill takes less than 2 seconds.     Findings: No erythema or rash.  Neurological:     General:  No focal deficit present.     Mental Status: She is alert and oriented to person, place, and time.  Psychiatric:        Mood and Affect: Mood normal.        Behavior: Behavior normal.        Thought Content: Thought content normal.        Judgment: Judgment normal.      UC Treatments / Results  Labs (all labs ordered are listed, but only abnormal results are displayed) Labs Reviewed  COMPREHENSIVE METABOLIC PANEL - Abnormal; Notable for the following components:      Result Value   Creatinine, Ser 1.02 (*)    Albumin 3.4 (*)    All other components within normal limits  CBC WITH DIFFERENTIAL/PLATELET  URINALYSIS, ROUTINE W REFLEX MICROSCOPIC    EKG   Radiology No results found.  Procedures Procedures (including critical care time)  Medications Ordered in UC Medications - No data to display  Initial Impression / Assessment and Plan / UC Course  I have reviewed the triage vital signs and the nursing notes.  Pertinent labs & imaging results that were available during my care of the patient were reviewed by me and considered in my medical decision making (see chart for details).  Patient is a nontoxic-appearing 51 year old female with history of renal transplant here for evaluation of fatigue x1/2 weeks, nausea times a week, nasal congestion x3 weeks with green nasal discharge.  She has not had any fevers.  She does endorse some intermittent transient abdominal pain but nothing constant.  She has had no vomiting but has had some nausea.  She also endorses urinary frequency but denies any urgency or dysuria.  Patient routine blood work in April 2023 which showed normal renal function and normal H&H though she does have a history of anemia of chronic disease.  She also had an  iron panel drawn at that time which was normal.  She is concerned that her iron levels may have dropped as she states they tend to drop with the change of weather.  She is on tacrolimus and CellCept for antirejection medication for her renal transplant.  With her ongoing nausea and fatigue I am concerned that she may have an infectious process.  I will check a CBC, CMP, and urinalysis.  Patient's physical exam reveals pearly-gray tympanic membranes bilaterally with normal light reflex and clear expiratory canals.  Nasal mucosa is markedly erythematous and edematous with yellow discharge in both nares.  Oropharyngeal exam reveals mild posterior oropharyngeal erythema but no exudate or postnasal drip.  No cervical lymphadenopathy appreciable exam.  Cardiopulmonary exam reveals S1-S2 heart sounds with regular rate and rhythm and lung sounds that are clear to auscultation all fields.  CBC is unremarkable.  H&H is 13 and 42.3.  Platelets are 221.  Overall red count is 4.75.  CBC shows an elevated creatinine of 1.02 which is up from 0.92 months ago.  Her albumin is 3.4 which is on par with her previous values.  Urinalysis is unremarkable.  Patient exam does demonstrate the presence of an upper respiratory infection.  Given that she has had nasal congestion with green nasal discharge for 3 weeks I will do a trial of antibiotics.  I will place her on Augmentin twice daily for 10 days.  If her symptoms continue she needs to follow-up with her PCP and transplant surgery.  Final Clinical Impressions(s) / UC Diagnoses   Final diagnoses:  Acute upper respiratory infection  Discharge Instructions      Your blood work today did not show any signs of anemia or electrolyte imbalances that could indicate your reason for fatigue.  Your urinalysis did not show any signs of infection.  Your physical exam does reveal that you have an upper respiratory infection and this may be the cause of your fatigue and the  nausea may be coming from your postnasal drip.  Take the Augmentin twice daily with food for 10 days for treatment of your upper respiratory infection.  Use the Atrovent nasal spray, 2 squirts up each nostril every 6 hours, to help with nasal congestion and postnasal drip.  If your symptoms do not improve, or they worsen, I recommend following up with your primary care provider and also transplant surgery.     ED Prescriptions     Medication Sig Dispense Auth. Provider   amoxicillin-clavulanate (AUGMENTIN) 875-125 MG tablet Take 1 tablet by mouth every 12 (twelve) hours for 10 days. 20 tablet Margarette Canada, NP   ipratropium (ATROVENT) 0.06 % nasal spray Place 2 sprays into both nostrils 4 (four) times daily. 15 mL Margarette Canada, NP      PDMP not reviewed this encounter.   Margarette Canada, NP 05/27/22 1752

## 2022-05-27 NOTE — Discharge Instructions (Addendum)
Your blood work today did not show any signs of anemia or electrolyte imbalances that could indicate your reason for fatigue.  Your urinalysis did not show any signs of infection.  Your physical exam does reveal that you have an upper respiratory infection and this may be the cause of your fatigue and the nausea may be coming from your postnasal drip.  Take the Augmentin twice daily with food for 10 days for treatment of your upper respiratory infection.  Use the Atrovent nasal spray, 2 squirts up each nostril every 6 hours, to help with nasal congestion and postnasal drip.  If your symptoms do not improve, or they worsen, I recommend following up with your primary care provider and also transplant surgery.

## 2022-05-27 NOTE — ED Triage Notes (Signed)
Pt comes in c/o nausea, just not feeling good for about a week or a week and a half. Endorses fatigue. Denies vomiting. Non-committal when asked about diarrhea.   Pt reports that she is having some pain in her abd, but it's not constant, it comes and goes and she can't describe it nor tell this RN where it is in her belly.   States "I just know when I feel like this something is out of whack."  Has a history of low iron

## 2022-10-07 ENCOUNTER — Ambulatory Visit
Admission: EM | Admit: 2022-10-07 | Discharge: 2022-10-07 | Disposition: A | Discharge status: Home / Self Care | Payer: Medicare Other | Attending: Emergency Medicine | Admitting: Emergency Medicine

## 2022-10-07 ENCOUNTER — Other Ambulatory Visit: Payer: Self-pay

## 2022-10-07 DIAGNOSIS — U071 COVID-19: Secondary | ICD-10-CM | POA: Diagnosis not present

## 2022-10-07 MED ORDER — MOLNUPIRAVIR EUA 200MG CAPSULE: 4.0000 | ORAL_CAPSULE | Freq: Two times a day (BID) | ORAL | 0 refills | Status: AC

## 2022-10-07 MED ORDER — ONDANSETRON 4 MG PO TBDP: 4.0000 mg | ORAL_TABLET | Freq: Three times a day (TID) | ORAL | 0 refills | Status: DC | PRN

## 2022-10-07 MED ORDER — CYCLOBENZAPRINE HCL 5 MG PO TABS: 5.0000 mg | ORAL_TABLET | Freq: Three times a day (TID) | ORAL | 0 refills | Status: AC | PRN

## 2022-10-07 NOTE — ED Provider Notes (Signed)
[facility]    CSN: [id number] Arrival date & time: 10/07/22  1539      History   Chief Complaint Chief Complaint  Patient presents with   Covid Positive    Pt with COVID positive at home.     HPI Helen Gordon is a 51 y.o. female.   Patient presents with chills, body aches, general malaise and fatigue, vomiting and a generalized headache beginning 1 day ago.  Last occurrence of vomiting today.  Decreased appetite but tolerating some fluids.  Home COVID test positive today no known sick contacts.  Has attempted use of TheraFlu and a different cold and flu mixture which have been ineffective.  History of a renal transplant, hypertension, anemia and migraines.  Denies cough, shortness of breath or wheezing.  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      Home Medications    Prior to Admission medications   Medication Sig Start Date End Date Taking? Authorizing Provider  aspirin 81 MG EC tablet Take by mouth. 12/04/19   [provider]  Biotin 5 MG TBDP Take by mouth. 01/31/19   [provider]  calcitRIOL (ROCALTROL) 0.25 MCG capsule  03/18/20   [provider]  ipratropium (ATROVENT) 0.06 % nasal spray Place 2 sprays into both nostrils 4 (four) times daily. 05/27/22   Margarette Canada, NP  multivitamin-iron-minerals-folic acid (CENTRUM) chewable tablet Chew 1 tablet by mouth daily.    [provider]  mycophenolate (CELLCEPT) 250 MG capsule Take by mouth.  04/04/19   [provider]  omeprazole (PRILOSEC) 20 MG capsule Take 20 mg by mouth daily.    [provider]  predniSONE (DELTASONE) 5 MG tablet Take by mouth. 04/03/19   [provider]  Tacrolimus ER (ENVARSUS XR) 1 MG TB24 Take by mouth. 11/23/19   [provider]  albuterol (VENTOLIN HFA) 108 (90 Base) MCG/ACT inhaler Inhale 2 puffs into the lungs every 6 (six) hours as needed for wheezing or shortness of breath. 07/18/19 04/21/20  Lannie Fields, PA-C  cetirizine (ZYRTEC) 10 MG tablet Take 10 mg by mouth daily.  04/21/20  [provider]  fluticasone (FLONASE) 50 MCG/ACT nasal spray Place 1 spray into both nostrils daily for 7 days. 07/18/19 04/21/20  Lannie Fields, PA-C  simvastatin (ZOCOR) 10 MG tablet Take 10 mg by mouth daily.  04/21/20  [provider]    Family History History reviewed. No pertinent family history.  Social History Social History   Tobacco Use   Smoking status: Never   Smokeless tobacco: Never  Vaping Use   Vaping Use: Never used  Substance Use Topics   Alcohol use: No   Drug use: No     Allergies   Bee pollen, Morphine, Codeine, Iodine, and Shellfish allergy   Review of Systems Review of Systems   Physical Exam Triage Vital Signs ED Triage Vitals  Enc Vitals Group     BP 10/07/22 1610 105/64     Pulse Rate 10/07/22 1610 (!) 110     Resp 10/07/22 1610 18     Temp 10/07/22 1610 98.1 F (36.7 C)     Temp Source 10/07/22 1610 Oral     SpO2 10/07/22 1610 99 %     Weight 10/07/22 1607 208 lb (94.3 kg)     Height 10/07/22 1607 5\' 2"  (1.575 m)     Head Circumference --      Peak Flow --      Pain Score 10/07/22 1606 3     Pain Loc --      Pain Edu? --      Excl. in [place]? --    No data found.  Updated Vital Signs BP 105/64 (BP Location: Right Arm)   Pulse (!) 110   Temp 98.1 F (36.7 C) (Oral)   Resp 18   Ht 5\' 2"  (1.575 m)   Wt 208 lb (94.3 kg)   SpO2 99%   BMI 38.04 kg/m   Visual  Acuity Right Eye Distance:   Left Eye Distance:   Bilateral Distance:    Right Eye Near:   Left Eye Near:    Bilateral Near:     Physical Exam Constitutional:      Appearance: She is ill-appearing.  HENT:     Head: Normocephalic.     Right Ear: Tympanic membrane, ear canal and external ear normal.     Left Ear: Tympanic membrane, ear canal and external ear normal.     Nose: Congestion present. No rhinorrhea.     Mouth/Throat:     Mouth: Mucous membranes are moist.     Pharynx: Oropharynx is clear.  Eyes:     Extraocular Movements: Extraocular movements intact.  Cardiovascular:     Rate and Rhythm: Normal rate and regular rhythm.     Pulses: Normal pulses.     Heart sounds: Normal heart sounds.  Pulmonary:     Effort: Pulmonary effort is normal.     Breath sounds: Normal breath sounds.  Musculoskeletal:     Cervical back: Normal range of motion and neck supple.  Skin:    General: Skin is warm and dry.  Neurological:     Mental Status: She is alert and oriented to person, place, and time. Mental status is at baseline.  Psychiatric:        Mood and Affect: Mood normal.        Behavior: Behavior normal.      UC Treatments / Results  Labs (all labs ordered are listed, but only abnormal results are displayed) Labs Reviewed - No data to display  EKG   Radiology No results found.  Procedures Procedures (including critical care time)  Medications Ordered in UC Medications - No data to display  Initial Impression / Assessment and Plan / UC Course  I have reviewed the triage vital signs and the nursing notes.  Pertinent labs & imaging results that were available during my care of the patient were reviewed by me and considered in my medical decision making (see chart for details).  COVID-19  Vital signs signs are stable and while ill-appearing patient is in no signs of distress nor toxic appearing.    Low suspicion for pneumonia, pneumothorax or bronchitis and  therefore will defer imaging.  COVID test positive, will not repeat PCR, discussed with patient, antiviral prescribed, unable to use Paxlovid due to daily medication  interactions, prescribed Flexeril and Zofran for additional supportive measures as body aches and vomiting are most worrisome symptoms. May use additional over-the-counter medications as needed for supportive care.  May follow-up with urgent care as needed if symptoms persist or worsen.  Note given.   Final Clinical Impressions(s) / UC Diagnoses   Final diagnoses:  None   Discharge Instructions   None    ED Prescriptions   None    PDMP not reviewed this encounter.   Hans Eden, [place] 10/07/22 [phone]

## 2022-10-07 NOTE — ED Triage Notes (Signed)
COVID positive at home. Endorses generalized body aches, vomited yesterday, fatigue, headache.

## 2022-10-07 NOTE — Discharge Instructions (Signed)
Your home COVID test was positive we will move forward with treatment today, you will need to quarantine for 5 days per CDC recommendations and may return activity on Tuesday, if still having symptoms please wear mask for an additional 5 days  Begin use of antiviral treatment taking every morning and every evening for 5 days, this medicine reduces the amount of virus within the body and ideally will reduce your symptoms as well as the timeline that you are sick, does not fully take away illness  You may use muscle relaxer every 8 hours for management of body aches, be mindful this will make you drowsy, may also use heating pad, massage and stretching for additional support  May use Zofran for 8 hours to help calm nausea and vomiting, dissolve under tongue and then wait 30 minutes to an hour before attempting to eat or drink, please increase your fluid intake until you are able to tolerate food to maintain your hydration   You can take Tylenol and/or Ibuprofen as needed for fever reduction and pain relief.   For cough: honey 1/2 to 1 teaspoon (you can dilute the honey in water or another fluid).  You can also use guaifenesin for cough. You can use a humidifier for chest congestion and cough.  If you don't have a humidifier, you can sit in the bathroom with the hot shower running.      For sore throat: try warm salt water gargles, cepacol lozenges, throat spray, warm tea or water with lemon/honey, popsicles or ice, or OTC cold relief medicine for throat discomfort.   For congestion: take a daily anti-histamine like Zyrtec, Claritin.  It is important to stay hydrated: drink plenty of fluids (water, gatorade/powerade/pedialyte, juices, or teas) to keep your throat moisturized and help further relieve irritation/discomfort.

## 2022-10-11 ENCOUNTER — Encounter: Payer: Self-pay | Admitting: Emergency Medicine

## 2022-10-11 ENCOUNTER — Ambulatory Visit
Admission: EM | Admit: 2022-10-11 | Discharge: 2022-10-11 | Disposition: A | Discharge status: Home / Self Care | Payer: Medicare Other

## 2022-10-11 DIAGNOSIS — I493 Ventricular premature depolarization: Secondary | ICD-10-CM | POA: Diagnosis not present

## 2022-10-11 DIAGNOSIS — R051 Acute cough: Secondary | ICD-10-CM

## 2022-10-11 DIAGNOSIS — R490 Dysphonia: Secondary | ICD-10-CM

## 2022-10-11 DIAGNOSIS — U071 COVID-19: Secondary | ICD-10-CM | POA: Diagnosis not present

## 2022-10-11 DIAGNOSIS — I499 Cardiac arrhythmia, unspecified: Secondary | ICD-10-CM | POA: Diagnosis not present

## 2022-10-11 NOTE — ED Triage Notes (Signed)
Pt presents with loss of voice, cough and bilateral leg pain, Pt was seen 11/2 she had a positive covid test.

## 2022-10-11 NOTE — Discharge Instructions (Addendum)
-  We did an EKG today which shows PVCs.  This is nothing to worry about muscles or to have chest pain or shortness of breath, dizziness or weakness.  If you have those symptoms, call 911 or go to ER. - You will likely need more time to recover from [place] and your voice hoarseness.  Plenty rest and fluids, tea with honey/lemon, Tylenol, throat lozenges.  Plain Mucinex for cough if needed. - Return if you are still not feeling better in the next few days if you need your work note extended, if you have a fever, worsening cough or increased shortness of breath.

## 2022-10-11 NOTE — ED Provider Notes (Signed)
[facility]    CSN: [id number] Arrival date & time: 10/11/22  1649      History   Chief Complaint Chief Complaint  Patient presents with   Laryngitis   Cough    HPI Helen Gordon is a 51 y.o. female presenting for continued COVID symptoms.  She was initially seen on 11/2 for positive COVID test.  She was started on molnupiravir.  She reports that she is almost done with that medication and that has been helpful.  Her symptoms have improved but she feels that she is not ready to go back to work.  She denies any fevers and feels that she is breathing normally.  She reports continued dry cough, fatigue, body aches and now states that her voice has been very hoarse.  She is denying any sore throat but feels some postnasal drainage.  She is status post kidney transplant at the end of 2019.  She has not been taking any cough medication since she is a kidney transplant patient.  She says that she is mostly here for a work note because she does not feel that she is ready to return to work tomorrow.  No new or worsening symptoms.  No other complaints.  Of note she is not having a chest pain, palpitations, dizziness or weakness.  No history of any heart problems.  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      Home Medications    Prior to Admission medications   Medication Sig Start Date End Date Taking? Authorizing Provider  nortriptyline (PAMELOR) 10 MG capsule Take by mouth. 07/27/19  Yes  [provider]  aspirin 81 MG EC tablet Take by mouth. 12/04/19   [provider]  Biotin 5 MG TBDP Take by mouth. 01/31/19   [provider]  calcitRIOL (ROCALTROL) 0.25 MCG capsule  03/18/20   [provider]  cyclobenzaprine (FLEXERIL) 5 MG tablet Take 1 tablet (5 mg total) by mouth 3 (three) times daily as needed for muscle spasms. 10/07/22   White, Leitha Schuller, NP  ipratropium (ATROVENT) 0.06 % nasal spray Place 2 sprays into both nostrils 4 (four) times daily. 05/27/22   Margarette Canada, NP  molnupiravir EUA (LAGEVRIO) 200 mg CAPS capsule Take 4 capsules (800 mg total) by mouth 2 (two) times daily for 5 days. 10/07/22 10/12/22  Hans Eden, NP  multivitamin-iron-minerals-folic acid (CENTRUM) chewable tablet Chew 1 tablet by mouth daily.    [provider]  mycophenolate (CELLCEPT) 250 MG capsule Take by mouth. 04/04/19   [provider]  omeprazole (PRILOSEC) 20 MG capsule Take 20 mg by mouth daily.    [provider]  ondansetron (ZOFRAN-ODT) 4 MG disintegrating tablet Take 1 tablet (4 mg total) by mouth every 8 (eight) hours as needed for nausea or vomiting. 10/07/22   White, Leitha Schuller, NP  predniSONE (DELTASONE) 5 MG tablet  Take by mouth. 04/03/19   [provider]  Tacrolimus ER (ENVARSUS XR) 1 MG TB24 Take by mouth. 11/23/19   [provider]  albuterol (VENTOLIN HFA) 108 (90 Base) MCG/ACT inhaler Inhale 2 puffs into the lungs every 6 (six) hours as needed for wheezing or shortness of breath. 07/18/19 04/21/20  Lannie Fields, PA-C  cetirizine (ZYRTEC) 10 MG tablet Take 10 mg by mouth daily.  04/21/20  [provider]  fluticasone (FLONASE) 50 MCG/ACT nasal spray Place 1 spray into both nostrils daily for 7 days. 07/18/19 04/21/20  Lannie Fields, PA-C  simvastatin (ZOCOR) 10 MG tablet Take 10 mg by mouth daily.  04/21/20  [provider]    Family History History reviewed. No pertinent family  history.  Social History Social History   Tobacco Use   Smoking status: Never   Smokeless tobacco: Never  Vaping Use   Vaping Use: Never used  Substance Use Topics   Alcohol use: No   Drug use: No     Allergies   Bee pollen, Morphine, Codeine, Iodine, and Shellfish allergy   Review of Systems Review of Systems  Constitutional:  Positive for fatigue. Negative for chills, diaphoresis and fever.  HENT:  Positive for congestion, postnasal drip and voice change. Negative for ear pain, rhinorrhea, sinus pressure, sinus pain and sore throat.   Respiratory:  Positive for cough. Negative for shortness of breath.   Cardiovascular:  Negative for chest pain and palpitations.  Gastrointestinal:  Negative for abdominal pain, nausea and vomiting.  Musculoskeletal:  Positive for myalgias. Negative for arthralgias.  Skin:  Negative for rash.  Neurological:  Negative for weakness and headaches.  Hematological:  Negative for adenopathy.     Physical Exam Triage Vital Signs ED Triage Vitals  Enc Vitals Group     BP 10/11/22 1724 114/81     Pulse Rate 10/11/22 1724 98     Resp 10/11/22 1724 18     Temp 10/11/22 1724 97.8 F (36.6 C)     Temp src --      SpO2 10/11/22 1724 97 %     Weight --      Height --      Head Circumference --      Peak Flow --      Pain Score 10/11/22 1722 0     Pain Loc --      Pain Edu? --      Excl. in [place]? --    No data found.  Updated Vital Signs BP 114/81 (BP Location: Right Arm)   Pulse 98   Temp 97.8 F (36.6 C)   Resp 18   SpO2 97%      Physical Exam Vitals and nursing note reviewed.  Constitutional:      General: She is not in acute distress.    Appearance: Normal appearance. She is not ill-appearing or toxic-appearing.  HENT:     Head: Normocephalic and atraumatic.     Right Ear: Tympanic membrane, ear canal and external ear normal.     Left Ear: Tympanic membrane, ear canal and external ear normal.     Nose: Congestion present.      Mouth/Throat:     Mouth: Mucous membranes are moist.     Pharynx: Oropharynx is clear.  Eyes:     General: No scleral icterus.       Right eye: No discharge.        Left eye: No discharge.  Conjunctiva/sclera: Conjunctivae normal.  Cardiovascular:     Rate and Rhythm: Normal rate. Rhythm irregular.     Heart sounds: Normal heart sounds.  Pulmonary:     Effort: Pulmonary effort is normal. No respiratory distress.     Breath sounds: Normal breath sounds.  Musculoskeletal:     Cervical back: Neck supple.  Skin:    General: Skin is dry.  Neurological:     General: No focal deficit present.     Mental Status: She is alert. Mental status is at baseline.     Motor: No weakness.     Gait: Gait normal.  Psychiatric:        Mood and Affect: Mood normal.        Behavior: Behavior normal.        Thought Content: Thought content normal.      UC Treatments / Results  Labs (all labs ordered are listed, but only abnormal results are displayed) Labs Reviewed - No data to display  EKG   Radiology No results found.  Procedures ED EKG  Date/Time: 10/11/2022 6:41 PM  Performed by: Danton Clap, PA-C Authorized by: Danton Clap, PA-C   Previous ECG:    Previous ECG:  Unavailable Interpretation:    Interpretation: abnormal   Rate:    ECG rate:  86   ECG rate assessment: normal   Rhythm:    Rhythm: sinus rhythm   Ectopy:    Ectopy: PVCs     PVCs:  Infrequent QRS:    QRS axis:  Normal   QRS intervals:  Normal   QRS conduction: normal   ST segments:    ST segments:  Normal T waves:    T waves: normal   Comments:     Normal sinus rhythm with occasional PVCs.  (including critical care time)  Medications Ordered in UC Medications - No data to display  Initial Impression / Assessment and Plan / UC Course  I have reviewed the triage vital signs and the nursing notes.  Pertinent labs & imaging results that were available during my care of the patient were  reviewed by me and considered in my medical decision making (see chart for details).   51 year old female kidney transplant patient presents for continued COVID symptoms.  She was seen on 10/07/2022 and started on molnupiravir for positive COVID test.  She reports continued symptoms that have not gotten any worse.  They have improved a little from onset but she continues to have dry cough, fatigue, voice hoarseness and body aches.  No chest pain, palpitations, dizziness, weakness.  Vitals normal and stable patient is overall well-appearing.  Exam she has nasal congestion and postnasal drainage.  Heart rhythm is irregular.  Chest clear auscultation.  EKG performed today shows normal sinus rhythm with occasional PVCs.  Advised patient that her EKG was a little abnormal but nothing significantly concerning however, if she does develop chest pain, palpitations, dizziness, weakness she needs to be seen again right away I would advise going to the emergency department.  At this time, encouraged her to rest her voice and increase her fluid intake.  We will keep her out of work for the next couple of days.  Reviewed returning if she develops a fever, worsening cough, shortness of breath to be reevaluated for possible pneumonia but low concern for that at this time based on her exam.   Final Clinical Impressions(s) / UC Diagnoses   Final diagnoses:  COVID-19  Acute cough  Voice  hoarseness  Asymptomatic PVCs     Discharge Instructions      -We did an EKG today which shows PVCs.  This is nothing to worry about muscles or to have chest pain or shortness of breath, dizziness or weakness.  If you have those symptoms, call 911 or go to ER. - You will likely need more time to recover from [place] and your voice hoarseness.  Plenty rest and fluids, tea with honey/lemon, Tylenol, throat lozenges.  Plain Mucinex for cough if needed. - Return if you are still not feeling better in the next few days if you need  your work note extended, if you have a fever, worsening cough or increased shortness of breath.     ED Prescriptions   None    PDMP not reviewed this encounter.   Danton Clap, PA-C 10/11/22 1843

## 2023-05-17 ENCOUNTER — Ambulatory Visit
Admission: EM | Admit: 2023-05-17 | Discharge: 2023-05-17 | Disposition: A | Discharge status: Home / Self Care | Payer: 59

## 2023-05-17 DIAGNOSIS — R197 Diarrhea, unspecified: Secondary | ICD-10-CM | POA: Diagnosis not present

## 2023-05-17 DIAGNOSIS — N951 Menopausal and female climacteric states: Secondary | ICD-10-CM

## 2023-05-17 NOTE — ED Triage Notes (Signed)
Patient presents to UC for diarrhea and fatigue x 3 day. Not taking any OTC meds.

## 2023-05-17 NOTE — ED Provider Notes (Signed)
[facility]    CSN: [id number] Arrival date & time: 05/17/23  1649      History   Chief Complaint Chief Complaint  Patient presents with   Fatigue   Diarrhea    HPI Helen Gordon is a 52 y.o. female.   52 year old female, Helen Gordon, presents to urgent care chief complaint of diarrhea and fatigue for 3 days.  Patient reports she takes tacrolimus for kidney transplant and also takes [facility], patient states she normally has diarrhea constipation with these medications.  Patient also complaining of hot flashes recently had a physical and was offered treatment for hot flashes the patient declined per patient report ,patient has not take anything for her symptoms.  Patient states she did not go to work yesterday or today and requesting a work note, patient is drinking plenty of fluids staying hydrated.  The history is provided by the patient. No language interpreter was used.    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      Home Medications    Prior to Admission medications   Medication Sig Start Date End Date Taking? Authorizing Provider  aspirin 81 MG EC tablet Take by mouth. 12/04/19   [provider]  Biotin 5 MG TBDP Take by mouth. 01/31/19   [provider]  calcitRIOL (ROCALTROL) 0.25 MCG capsule  03/18/20   [provider]  cyclobenzaprine (FLEXERIL) 5 MG tablet Take 1 tablet (5 mg total)  by mouth 3 (three) times daily as needed for muscle spasms. 10/07/22   White, Elita Boone, NP  ipratropium (ATROVENT) 0.06 % nasal spray Place 2 sprays into both nostrils 4 (four) times daily. 05/27/22   Becky Augusta, NP  multivitamin-iron-minerals-folic acid (CENTRUM) chewable tablet Chew 1 tablet by mouth daily.    [provider]  mycophenolate (CELLCEPT) 250 MG capsule Take by mouth. 04/04/19   [provider]  nortriptyline (PAMELOR) 10 MG capsule Take by mouth. 07/27/19   [provider]  omeprazole (PRILOSEC) 20 MG capsule Take 20 mg by mouth daily.    [provider]  ondansetron (ZOFRAN-ODT) 4 MG disintegrating tablet Take 1 tablet (4 mg total) by mouth every 8 (eight) hours as needed for nausea or vomiting. 10/07/22   Valinda Hoar, NP  predniSONE (DELTASONE) 5 MG tablet Take by mouth. 04/03/19   [provider]  Tacrolimus ER (ENVARSUS XR) 1 MG TB24 Take by mouth. 11/23/19   [provider]  albuterol (VENTOLIN HFA) 108 (90 Base) MCG/ACT inhaler Inhale 2 puffs into the lungs every 6 (six) hours as needed for wheezing or shortness of breath. 07/18/19 04/21/20  Orvil Feil, PA-C  cetirizine (ZYRTEC) 10 MG tablet Take 10 mg by mouth daily.  04/21/20  [provider]  fluticasone (FLONASE) 50 MCG/ACT nasal spray Place 1 spray into both nostrils  daily for 7 days. 07/18/19 04/21/20  Orvil Feil, PA-C  simvastatin (ZOCOR) 10 MG tablet Take 10 mg by mouth daily.  04/21/20  [provider]    Family History History reviewed. No pertinent family history.  Social History Social History   Tobacco Use   Smoking status: Never   Smokeless tobacco: Never  Vaping Use   Vaping Use: Never used  Substance Use Topics   Alcohol use: No   Drug use: No     Allergies   Bee pollen, Morphine, Other, Codeine, Iodine, and Shellfish allergy   Review of Systems Review of Systems  Constitutional:  Positive for fatigue. Negative  for fever.  Gastrointestinal:  Positive for diarrhea.  All other systems reviewed and are negative.    Physical Exam Triage Vital Signs ED Triage Vitals  Enc Vitals Group     BP 05/17/23 1718 110/86     Pulse Rate 05/17/23 1718 86     Resp 05/17/23 1718 18     Temp 05/17/23 1718 97.9 F (36.6 C)     Temp Source 05/17/23 1718 Oral     SpO2 05/17/23 1718 100 %     Weight --      Height --      Head Circumference --      Peak Flow --      Pain Score 05/17/23 1716 0     Pain Loc --      Pain Edu? --      Excl. in GC? --    No data found.  Updated Vital Signs BP 110/86 (BP Location: Right Arm)   Pulse 86   Temp 97.9 F (36.6 C) (Oral)   Resp 18   LMP 12/03/2019   SpO2 100%   Visual Acuity Right Eye Distance:   Left Eye Distance:   Bilateral Distance:    Right Eye Near:   Left Eye Near:    Bilateral Near:     Physical Exam Vitals and nursing note reviewed.  Constitutional:      Appearance: Normal appearance. She is well-developed and well-groomed.  HENT:     Head: Normocephalic.  Cardiovascular:     Rate and Rhythm: Normal rate and regular rhythm.     Pulses: Normal pulses.     Heart sounds: Normal heart sounds.  Pulmonary:     Effort: Pulmonary effort is normal.     Breath sounds: Normal breath sounds and air entry.  Neurological:     General: No focal deficit present.     Mental Status: She is alert and oriented to person, place, and time.     GCS: GCS eye subscore is 4. GCS verbal subscore is 5. GCS motor subscore is 6.  Psychiatric:        Attention and Perception: Attention normal.        Mood and Affect: Mood normal.        Speech: Speech normal.        Behavior: Behavior normal. Behavior is cooperative.      UC Treatments / Results  Labs (all labs ordered are listed, but only abnormal results are displayed) Labs Reviewed - No data to display  EKG   Radiology No results found.  Procedures Procedures (including critical care  time)  Medications Ordered in UC Medications - No data to display  Initial Impression / Assessment and Plan / UC Course  I have reviewed the triage vital signs and the nursing notes.  Pertinent labs & imaging  results that were available during my care of the patient were reviewed by me and considered in my medical decision making (see chart for details).    Patient verbalized understanding to this provider, work note given.  Will follow-up with PCP transplant coordinator.  Go to ER for new or worsening issues or concerns.  Ddx: Diarrhea, fatigue, viral illness Final Clinical Impressions(s) / UC Diagnoses   Final diagnoses:  Menopausal syndrome (hot flashes)  Diarrhea, unspecified type     Discharge Instructions      Please contact your repeat PCP regarding your Wegovy and possible side effects.  Please follow-up with your transplant coordinator regarding your tacrolimus and symptoms, may need to have a level checked.  Discussed food choices to help relieve diarrhea, handout.  Go to ER for worsening issues(unable to keep food or fluid down, fever, etc.)    ED Prescriptions   None    PDMP not reviewed this encounter.   Clancy Gourd, NP 05/17/23 2016

## 2023-05-17 NOTE — Discharge Instructions (Addendum)
Please contact your repeat PCP regarding your Wegovy and possible side effects.  Please follow-up with your transplant coordinator regarding your tacrolimus and symptoms, may need to have a level checked.  Discussed food choices to help relieve diarrhea, handout.  Go to ER for worsening issues(unable to keep food or fluid down, fever, etc.)

## 2024-03-28 LAB — COLOGUARD: COLOGUARD: NEGATIVE

## 2024-06-28 ENCOUNTER — Ambulatory Visit (INDEPENDENT_AMBULATORY_CARE_PROVIDER_SITE_OTHER)

## 2024-06-28 ENCOUNTER — Ambulatory Visit: Admission: EM | Admit: 2024-06-28 | Discharge: 2024-06-28 | Disposition: A | Discharge status: Home / Self Care

## 2024-06-28 ENCOUNTER — Encounter: Payer: Self-pay | Admitting: *Deleted

## 2024-06-28 DIAGNOSIS — S92511A Displaced fracture of proximal phalanx of right lesser toe(s), initial encounter for closed fracture: Secondary | ICD-10-CM

## 2024-06-28 DIAGNOSIS — S99921A Unspecified injury of right foot, initial encounter: Secondary | ICD-10-CM | POA: Diagnosis not present

## 2024-06-28 NOTE — ED Triage Notes (Signed)
 Patient states she hit right foot on bed post 2 weeks ago and it went between 4th and 5th toes.  No broken skin or bruising but still hurts and she states it swells sometimes.

## 2024-06-28 NOTE — Discharge Instructions (Addendum)
 Your pinky 5th toe is broken, wear post op shoe Buddy tape for support Follow up with podiatrist/orthopedics-call for appt next week, sooner if worse Rest, ice, elevate when you can May take Tylenol as label directed for pain [facility]: [address] [place], [place] [address] Phone: [phone] Urgent care hours 8a-7:30p Mon-Sat

## 2024-06-28 NOTE — ED Provider Notes (Signed)
 [facility]    CSN: [id number] Arrival date & time: 06/28/24  1820      History   Chief Complaint Chief Complaint  Patient presents with   Foot Injury    HPI Helen Gordon is a 53 y.o. female.   53 year old female, Helen Gordon, presents to urgent care for evaluation of right fourth fifth toe after she hit it on/between bedpost 2 weeks prior.  Patient has no broken skin or bruising but has pain and swelling when she stands on it.  Patient has taken aspirin for pain.  No prior history of injury  The history is provided by the patient. No language interpreter was used.    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      Home Medications    Prior to Admission medications   Medication Sig Start Date End Date Taking? Authorizing Provider  mycophenolate (CELLCEPT) 250 MG capsule Take 500 mg by mouth. 05/25/24  Yes [provider]  tacrolimus ER (ENVARSUS XR) 1 MG TB24 Take 4 mg by mouth. 08/11/23 07/09/24 Yes [provider]  aspirin 81 MG EC tablet Take by mouth. 12/04/19   [provider]  Biotin 5 MG TBDP Take by mouth. 01/31/19   [provider]  calcitRIOL (ROCALTROL) 0.25 MCG capsule  03/18/20   [provider]  cyclobenzaprine  (FLEXERIL ) 5 MG  tablet Take 1 tablet (5 mg total) by mouth 3 (three) times daily as needed for muscle spasms. 10/07/22   White, Adrienne R, NP  ipratropium (ATROVENT ) 0.06 % nasal spray Place 2 sprays into both nostrils 4 (four) times daily. 05/27/22   Bernardino Ditch, NP  multivitamin-iron-minerals-folic acid (CENTRUM) chewable tablet Chew 1 tablet by mouth daily.    [provider]  mycophenolate (CELLCEPT) 250 MG capsule Take by mouth. 04/04/19   [provider]  nortriptyline (PAMELOR) 10 MG capsule Take by mouth. 07/27/19   [provider]  omeprazole (PRILOSEC) 20 MG capsule Take 20 mg by mouth daily.    [provider]  ondansetron  (ZOFRAN -ODT) 4 MG disintegrating tablet Take 1 tablet (4 mg total) by mouth every 8 (eight) hours as needed for nausea or vomiting. 10/07/22   Teresa Shelba SAUNDERS, NP  predniSONE (DELTASONE) 5 MG tablet Take by mouth. 04/03/19   [provider]  Tacrolimus ER (ENVARSUS XR) 1 MG TB24 Take by mouth. 11/23/19   [provider]  albuterol  (VENTOLIN  HFA) 108 (90 Base) MCG/ACT inhaler Inhale 2 puffs into the lungs every 6 (six) hours as needed for wheezing or shortness of breath. 07/18/19 04/21/20  Woods, Jaclyn M, PA-C  cetirizine (ZYRTEC) 10 MG tablet Take 10 mg by mouth daily.  04/21/20  [provider]  fluticasone  (FLONASE ) 50 MCG/ACT nasal spray Place 1 spray into both nostrils daily for 7 days. 07/18/19 04/21/20  Woods, Jaclyn M, PA-C  simvastatin (ZOCOR) 10 MG tablet Take 10 mg by mouth daily.  04/21/20  [provider]    Family History History reviewed. No pertinent family history.  Social History Social History   Tobacco Use   Smoking status: Never   Smokeless tobacco: Never  Vaping Use   Vaping status: Never Used  Substance Use Topics   Alcohol use: No   Drug use: No     Allergies   Bee pollen, Morphine, Other, Codeine, Iodine, and Shellfish allergy   Review of Systems Review of Systems   Musculoskeletal:  Positive for arthralgias, gait problem, joint swelling and myalgias.  Skin: Negative.   All other systems reviewed and are negative.    Physical Exam Triage Vital Signs ED Triage Vitals  Encounter Vitals Group     BP 06/28/24 1852 124/89     Girls Systolic BP Percentile --      Girls Diastolic BP Percentile --      Boys Systolic BP Percentile --      Boys Diastolic BP Percentile --      Pulse Rate 06/28/24 1852 70     Resp 06/28/24 1852 18     Temp 06/28/24 1852 97.7 F (36.5 C)     Temp Source 06/28/24 1852 Oral     SpO2 06/28/24 1852 95 %     Weight 06/28/24 1848 192 lb (87.1 kg)     Height 06/28/24 1848 5' 2 (1.575 m)     Head Circumference --      Peak Flow --      Pain Score 06/28/24 1848 5     Pain Loc --      Pain Education --      Exclude from Growth Chart --    No data found.  Updated Vital Signs BP 124/89 (BP Location: Right Arm)   Pulse 70   Temp 97.7 F (36.5 C) (Oral)   Resp 18   Ht 5' 2 (1.575 m)   Wt 192 lb (87.1 kg)   LMP 12/03/2019   SpO2 95%   BMI 35.12 kg/m   Visual Acuity Right Eye Distance:   Left Eye Distance:   Bilateral Distance:    Right Eye Near:   Left Eye Near:    Bilateral Near:     Physical Exam Vitals and nursing note reviewed.  Constitutional:      Appearance: She is well-developed and well-groomed.  Cardiovascular:     Rate and Rhythm: Normal rate.     Pulses:          Dorsalis pedis pulses are 2+ on the right side.  Musculoskeletal:       Feet:  Skin:    General: Skin is warm.     Capillary Refill: Capillary refill takes less than 2 seconds.     Findings: No ecchymosis or wound.     Comments: Intact, no wound  Neurological:     General: No focal deficit present.     Mental Status: She is alert and oriented to person, place, and time.     GCS: GCS eye subscore is 4. GCS verbal subscore is 5. GCS motor subscore is 6.  Psychiatric:        Attention and Perception: Attention normal.         Mood and Affect: Mood normal.  Speech: Speech normal.        Behavior: Behavior normal. Behavior is cooperative.      UC Treatments / Results  Labs (all labs ordered are listed, but only abnormal results are displayed) Labs Reviewed - No data to display  EKG   Radiology DG Foot Complete Right Result Date: 06/28/2024 EXAM: 3 or more VIEW(S) XRAY OF THE RIGHT FOOT 06/28/2024 07:25:22 PM COMPARISON: None available. CLINICAL HISTORY: Foot pain. Patient states she hit right foot on bed post 2 weeks ago and it went between 4th and 5th toes. No broken skin or bruising but still hurts and she states it swells sometimes. FINDINGS: BONES AND JOINTS: Nondisplaced transverse fracture involving the midshaft of the fifth proximal phalanx. No joint dislocation. SOFT TISSUES: The soft tissues are unremarkable. IMPRESSION: 1. Nondisplaced transverse fracture involving the midshaft of the fifth proximal phalanx. Electronically signed by: Pinkie Pebbles MD 06/28/2024 07:39 PM EDT RP Workstation: [id number]    Procedures Procedures (including critical care time)  Medications Ordered in UC Medications - No data to display  Initial Impression / Assessment and Plan / UC Course  I have reviewed the triage vital signs and the nursing notes.  Pertinent labs & imaging results that were available during my care of the patient were reviewed by me and considered in my medical decision making (see chart for details).  Clinical Course as of 06/28/24 2123  Thu Jun 28, 2024  1919 Right foot xray,injury 2 weeks prior [JD]  1932 Wet read by this provider shows transverse fracture of right 5th toe proximal phalange; postop shoe buddy tape applied by staff, neurovascular intact pre and post application [JD]    Clinical Course User Index [JD] Karson Reede, Rilla, NP   Discussed exam findings and plan of care with patient, buddy tape 4th and 5th toes together , postop shoe ordered ,strict go to ER precautions  given.   Patient referred to orthopedics or may follow-up with podiatry , patient verbalized understanding to this provider.  Ddx: Right foot injury, toe fracture Final Clinical Impressions(s) / UC Diagnoses   Final diagnoses:  Right foot injury, initial encounter  Closed displaced fracture of proximal phalanx of lesser toe of right foot, initial encounter     Discharge Instructions      Your pinky 5th toe is broken, wear post op shoe Buddy tape for support Follow up with podiatrist/orthopedics-call for appt next week, sooner if worse Rest, ice, elevate when you can May take Tylenol as label directed for pain [facility]: [address] [place], [place] [address] Phone: [phone] Urgent care hours 8a-7:30p Mon-Sat     ED Prescriptions   None    PDMP not reviewed this encounter.   Aminta Rilla, NP 06/28/24 2123

## 2024-07-27 NOTE — Telephone Encounter (Addendum)
 Reviewed via MyChart  ----- Message from Mikel Manly, MD sent at 07/27/2024  4:28 PM EDT ----- Thanks,  Looks good.   No change.   Irma.  ----- Message ----- From: Jerona Salines, RN Sent: 07/26/2024   3:28 PM EDT To: Mikel Manly, MD  Hi Dr. Manly Patient is   5y 49m Fk   4.5      Cr   0.8 Taking   Envarsus 4 mg, MMF 500/500 mg, Pred 5 mg Next appt   02/10 Labs every 3 months Late draw of 345.  Left message for patient to verify timing of Envarsus Crystal   ----- Message ----- From: Lab, Background User Sent: 07/17/2024   4:23 PM EDT To: Salines Jerona, RN

## 2024-10-24 NOTE — Progress Notes (Signed)
 " Chief Complaint  Patient presents with   Weight Check    Subjective  Helen Gordon is a 53 y.o. female who presents for Weight Check HPI History of Present Illness   Helen Gordon is a 53 year old female who presents for follow-up regarding her weight management with Zepbound.  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 She rotates injection sites and is considering using her arm with assistance. Despite the numbing cream, she finds it ineffective and is exploring other options like numbing patches or sprays. Her weight decreased from 206 to 204 pounds since Sunday. She is on a 2.5 mg dose and administers it weekly on Sundays. The medication's smell induces nausea, but she does not experience nausea or constipation as side effects. She reports increased energy levels.  She walks 7,000 steps daily, which is less than her previous activity level. She works at the [employer] and is considering retirement in four years. She has custody of her grandchildren, affecting her financial situation and lifestyle.      Review of Systems  Patient Active Problem List  Diagnosis   Congenital solitary kidney   Hx of migraine headaches   Hypertension   Hyperlipidemia   Iron deficiency anemia, unspecified iron deficiency anemia type   Anemia in CKD (chronic kidney disease)   Chronic tension-type headache, intractable   Dizziness   Hyperparathyroidism, secondary renal (HHS-HCC)   Pre-operative laboratory examination   Fatigue   S/P subtotal parathyroidectomy   Renal transplant recipient (HHS-HCC)   Kidney replaced by transplant (HHS-HCC)   Immunosuppressed status (HHS-HCC)   Prophylactic antibiotic   Status post placement of ureteral stent   At risk for steroid-induced hyperglycemia   Neuralgic migraines   Bee sting allergy   Shellfish allergy   Menopausal syndrome (hot flashes)   Avoidance of blood transfusions except in  life-threatening or emergency situations. See [facility] Consult note.   Advance directive on file    Outpatient Medications Prior to Visit  Medication Sig Dispense Refill   acetaminophen (TYLENOL) 325 MG tablet Take 1 tablet (325 mg total) by mouth every 8 (eight) hours as needed for Pain 60 tablet 0   aspirin 81 MG EC tablet Take 1 tablet (81 mg total) by mouth once daily 120 tablet 2   biotin 5,000 mcg TbDL Take 5,000 mcg by mouth once daily 100 tablet 3   calcitRIOL (ROCALTROL) 0.25 MCG capsule Take 1 capsule (0.25 mcg total) by mouth 2 (two) times daily 60 capsule 11   cholecalciferol, vitamin D3, (VITAMIN D3) 125 mcg (5,000 unit) tablet Take 1 tablet (5,000 Units total) by mouth once daily for 30 days 30 tablet 11   ENVARSUS XR 1 mg extended-release tablet Take 4 tablets (4 mg total) by mouth every morning before breakfast 120 tablet 11   mycophenolate (CELLCEPT) 250 mg capsule Take 2 capsules (500 mg total) by mouth every 12 (twelve) hours 120 capsule 11   omeprazole (PRILOSEC) 20 MG DR capsule Take 1 capsule (20 mg total) by mouth once daily 30 capsule 11   ondansetron  (ZOFRAN -ODT) 4 MG disintegrating tablet Take 4 mg by mouth every 8 (eight) hours as needed     predniSONE (DELTASONE) 5 MG tablet Take 1 tablet (5 mg total) by mouth once daily 30 tablet 11   ZEPBOUND 2.5 mg/0.5 mL pen injector Inject 0.5 mLs (2.5 mg total) subcutaneously once a week Patient to call after 4 weeks on  this dose for dose escalation if tolerating. 2 mL 0   pen needle, diabetic 32 gauge x 5/32 Ndle Use 1 each once a week (Patient not taking: Reported on 07/24/2024) 30 each 11   No facility-administered medications prior to visit.      Objective  Vitals:   10/24/24 1418  BP: 105/72  Pulse: 91  Temp: 36.3 C (97.4 F)  TempSrc: Oral  Weight: 92.9 kg (204 lb 12.8 oz)  PainSc: 0-No pain   Body mass index is 37.45 kg/m.  Home Vitals:     Physical Exam Physical  Exam    MEASUREMENTS: Weight- 204. GENERAL: Alert, cooperative, well developed, no acute distress CHEST: Clear to auscultation bilaterally, No wheezes, rhonchi, or crackles CARDIOVASCULAR: Normal heart rate and rhythm, S1 and S2 normal without murmurs EXTREMITIES: No cyanosis or edema NEUROLOGICAL: Cranial nerves grossly intact, Moves all extremities without gross motor or sensory deficit        Results             Assessment/Plan:   Assessment & Plan    Obesity  Started Zepbound 2.5 mg weekly, lost 2 pounds in two days. Well-tolerated with minimal side effects. Insurance coverage approved. Discussed importance of consistent administration. Encouraged gradual weight loss. - Continue Zepbound 2.5 mg weekly. - Use numbing patches or sprays for injections. - Monitor weight, adjust dosage if insurance allows increase to 5 mg. - Encourage regular physical activity, aim for 7000 steps daily.  General Health Maintenance  - Administered flu shot. - Recommend COVID vaccination. - Proceed with scheduled dental procedure in February.     Diagnoses and all orders for this visit:  Obesity (BMI 35.0-39.9 without comorbidity), unspecified -     ZEPBOUND 2.5 mg/0.5 mL pen injector; Inject 0.5 mLs (2.5 mg total) subcutaneously once a week for 90 days Patient to call after 4 weeks on this dose for dose escalation if tolerating.  Need for vaccination -     Flu Vaccine IIV3, IM PF (23MO+)(Fluarix, Flulaval, Fluzone) -     COVID-19 VACCINE 10MCG/0.2ML (>=78YRS) MNEXSPIKE MODERNA  Other orders -     Follow up in Primary Care -     Follow up in Primary Care; Future    This visit was coded based on medical decision making (MDM).    Follow up in 3 months (on 01/24/2025). Follow-up     Normal Orders This Visit   Follow up in Primary Care [[id number] Custom]    Questions:   Does this order need to be coordinated with another visit or should it be hidden from the patient portal? If yes  to either, the patient will need to stop by the front desk or call to schedule.: No   Who is this follow-up with?: Me   Can this appointment be overbooked by a scheduler?: No   What type of follow up is needed?: Office Visit   What's the reason for follow up?: Obesity/Weight   Allow telemedicine?: Video Visit Only    Future Labs/Procedures Expected by Expires   Follow up in Primary Care [[id number] Custom]  01/24/2025 03/24/2025   Questions:     Does this order need to be coordinated with another visit or should it be hidden from the patient portal? If yes to either, the patient will need to stop by the front desk or call to schedule.: No   Who is this follow-up with?: Me   Can this appointment be overbooked by a scheduler?: No   What  type of follow up is needed?: Office Visit   What's the reason for follow up?: Obesity/Weight   Allow telemedicine?: In Person Only         Future Appointments     Date/Time Provider Department Center Visit Type   01/15/2025 1:30 PM (Arrive by 1:00 PM) RENAL TRANSPLANT PROVIDER 1 [facility] TRANSPLANT FOLLOW-UP   02/01/2025 1:30 PM (Arrive by 1:15 PM) [facility] 1 [facility] at [facility] MM SCREEN BREAST TOMO BILAT   07/25/2025 2:20 PM (Arrive by 2:05 PM) Shelley Lax, NP [facility] PHYSICAL         An after visit summary was provided for the patient either in written format (printed) or through [facility].  This note has been created using automated tools and reviewed for accuracy by AMRITA RAMJI.  "

## 2024-11-28 ENCOUNTER — Emergency Department

## 2024-11-28 ENCOUNTER — Encounter: Payer: Self-pay | Admitting: Emergency Medicine

## 2024-11-28 ENCOUNTER — Other Ambulatory Visit: Payer: Self-pay

## 2024-11-28 ENCOUNTER — Emergency Department
Admission: EM | Admit: 2024-11-28 | Discharge: 2024-11-28 | Disposition: A | Discharge status: Home / Self Care | Attending: Emergency Medicine | Admitting: Emergency Medicine

## 2024-11-28 DIAGNOSIS — Z940 Kidney transplant status: Secondary | ICD-10-CM | POA: Diagnosis not present

## 2024-11-28 DIAGNOSIS — S61552A Open bite of left wrist, initial encounter: Secondary | ICD-10-CM | POA: Insufficient documentation

## 2024-11-28 DIAGNOSIS — S61452A Open bite of left hand, initial encounter: Secondary | ICD-10-CM | POA: Insufficient documentation

## 2024-11-28 DIAGNOSIS — I120 Hypertensive chronic kidney disease with stage 5 chronic kidney disease or end stage renal disease: Secondary | ICD-10-CM | POA: Insufficient documentation

## 2024-11-28 DIAGNOSIS — W540XXA Bitten by dog, initial encounter: Secondary | ICD-10-CM | POA: Insufficient documentation

## 2024-11-28 DIAGNOSIS — Z23 Encounter for immunization: Secondary | ICD-10-CM | POA: Diagnosis not present

## 2024-11-28 DIAGNOSIS — N186 End stage renal disease: Secondary | ICD-10-CM | POA: Insufficient documentation

## 2024-11-28 MED ORDER — TETANUS-DIPHTH-ACELL PERTUSSIS 5-2-15.5 LF-MCG/0.5 IM SUSP
0.5000 mL | Freq: Once | INTRAMUSCULAR | Status: AC
  Administered 2024-11-28: 0.5 mL via INTRAMUSCULAR
  Filled 2024-11-28: qty 0.5

## 2024-11-28 MED ORDER — ONDANSETRON 4 MG PO TBDP: 4.0000 mg | ORAL_TABLET | Freq: Four times a day (QID) | ORAL | 0 refills | Status: AC | PRN

## 2024-11-28 MED ORDER — AMOXICILLIN-POT CLAVULANATE 875-125 MG PO TABS
1.0000 | ORAL_TABLET | Freq: Once | ORAL | Status: AC
  Administered 2024-11-28: 1 via ORAL
  Filled 2024-11-28: qty 1

## 2024-11-28 MED ORDER — ONDANSETRON 4 MG PO TBDP
4.0000 mg | ORAL_TABLET | Freq: Once | ORAL | Status: AC
  Administered 2024-11-28: 4 mg via ORAL
  Filled 2024-11-28: qty 1

## 2024-11-28 MED ORDER — OXYCODONE HCL 5 MG PO TABS: 5.0000 mg | ORAL_TABLET | Freq: Three times a day (TID) | ORAL | 0 refills | Status: AC | PRN

## 2024-11-28 MED ORDER — BACITRACIN ZINC 500 UNIT/GM EX OINT
TOPICAL_OINTMENT | Freq: Once | CUTANEOUS | Status: AC
  Filled 2024-11-28: qty 0.9

## 2024-11-28 MED ORDER — LIDOCAINE-PRILOCAINE 2.5-2.5 % EX CREA
TOPICAL_CREAM | Freq: Once | CUTANEOUS | Status: AC
  Filled 2024-11-28: qty 5

## 2024-11-28 MED ORDER — LIDOCAINE HCL (PF) 1 % IJ SOLN
5.0000 mL | Freq: Once | INTRAMUSCULAR | Status: AC
  Administered 2024-11-28: 5 mL via INTRADERMAL
  Filled 2024-11-28: qty 5

## 2024-11-28 MED ORDER — AMOXICILLIN-POT CLAVULANATE 875-125 MG PO TABS: 1.0000 | ORAL_TABLET | Freq: Two times a day (BID) | ORAL | 0 refills | Status: AC

## 2024-11-28 MED ORDER — OXYCODONE HCL 5 MG PO TABS
5.0000 mg | ORAL_TABLET | Freq: Once | ORAL | Status: AC
  Administered 2024-11-28: 5 mg via ORAL
  Filled 2024-11-28: qty 1

## 2024-11-28 NOTE — ED Triage Notes (Addendum)
 Patient ambulatory to triage with steady gait, without difficulty or distress noted; pt reports that her two dogs were fighting and she attempted to break them up; bite wound to left wrist with scant bleeding; gauze dressing applied; st that her pets are up-to-date on vaccines

## 2024-11-28 NOTE — Discharge Instructions (Addendum)
 You may take over-the-counter Tylenol 1000 mg every 6 hours as needed for pain, fever.  Please take your antibiotics twice a day until complete.  Please keep your wound clean and dry.  You may clean this area with soap and water and pat dry 1-2 times a day or anytime that it is dirty.  I do not recommend over-the-counter antibiotic ointment as this may break your sutures down too quickly.  Your sutures are absorbable and will come out on their own in the next 1 to 2 weeks.  Dog bites are high risk for infection so if you notice increased redness, warmth, drainage of pus, fever of 100.4 or higher, pain that is uncontrolled with pain medication at home, please return to the emergency department.

## 2024-11-28 NOTE — ED Provider Notes (Signed)
 "  [facility] Provider Note    Event Date/Time   First MD Initiated Contact with Patient 11/28/24 0143     (approximate)   History   Animal Bite   HPI  Helen Gordon is a 52 y.o. female with history of hypertension, end-stage renal disease status post kidney transplant, hyperlipidemia who presents to the emergency department after she was bit in the left hand by one of her dogs when trying to break up a fight.  She states that both of her dogs are up-to-date on all of their vaccinations including rabies vaccines.  She states she has not had a tetanus vaccine in years.  She states she is having pain and swelling and bruising in the left hand and wrist.  She is right-hand dominant.  Denies any other injury.  No numbness.   History provided by patient, mother.    Past Medical History:  Diagnosis Date   Anemia in chronic kidney disease (CKD)    Hypercholesteremia    Hypertension    Kidney failure    Migraine    Solitary kidney, congenital     Past Surgical History:  Procedure Laterality Date   ARTERIOVENOUS FISTULA CREATION FEMORAL     CESAREAN SECTION     KIDNEY TRANSPLANT     RENAL BIOPSY, PERCUTANEOUS     THYROIDECTOMY, PARTIAL      MEDICATIONS:  Prior to Admission medications  Medication Sig Start Date End Date Taking? Authorizing Provider  aspirin 81 MG EC tablet Take by mouth. 12/04/19   [provider]  Biotin 5 MG TBDP Take by mouth. 01/31/19   [provider]  calcitRIOL (ROCALTROL) 0.25 MCG capsule  03/18/20   [provider]  cyclobenzaprine  (FLEXERIL ) 5 MG tablet Take 1 tablet (5 mg total) by mouth 3 (three) times daily as needed for muscle spasms. 10/07/22   White, Adrienne R, NP  ipratropium (ATROVENT ) 0.06 % nasal spray Place 2 sprays into both nostrils 4 (four) times daily. 05/27/22   Bernardino Ditch, NP  multivitamin-iron-minerals-folic acid (CENTRUM) chewable tablet Chew 1 tablet by mouth daily.    [provider]  mycophenolate (CELLCEPT) 250 MG capsule Take by mouth. 04/04/19   [provider]  mycophenolate (CELLCEPT) 250 MG capsule Take 500 mg by mouth. 05/25/24   [provider]  nortriptyline (PAMELOR) 10 MG capsule Take by mouth. 07/27/19   [provider]  omeprazole (PRILOSEC) 20 MG capsule Take 20 mg by mouth daily.    [provider]  ondansetron  (ZOFRAN -ODT) 4 MG disintegrating tablet Take 1 tablet (4 mg total) by mouth every 8 (eight) hours as needed for nausea or vomiting. 10/07/22   Teresa Shelba SAUNDERS, NP  predniSONE (DELTASONE) 5 MG tablet Take by mouth. 04/03/19   [provider]  Tacrolimus ER (ENVARSUS XR) 1 MG TB24 Take by mouth. 11/23/19   [provider]  albuterol  (VENTOLIN  HFA) 108 (90 Base) MCG/ACT inhaler Inhale 2 puffs into the lungs every 6 (six) hours as needed for wheezing or shortness of breath. 07/18/19 04/21/20  Woods, Jaclyn M, PA-C  cetirizine (ZYRTEC) 10 MG tablet Take 10 mg by mouth daily.  04/21/20  [provider]  fluticasone  (FLONASE ) 50 MCG/ACT nasal spray Place 1 spray into both nostrils daily for 7 days. 07/18/19 04/21/20  Woods, Jaclyn M, PA-C  simvastatin (ZOCOR) 10 MG tablet Take 10 mg by mouth daily.  04/21/20  [provider]    Physical Exam   Triage Vital  Signs: ED Triage Vitals  Encounter Vitals Group     BP 11/28/24 0257 (!) 139/94     Girls Systolic BP Percentile --      Girls Diastolic BP Percentile --      Boys Systolic BP Percentile --      Boys Diastolic BP Percentile --      Pulse Rate 11/28/24 0257 84     Resp 11/28/24 0257 16     Temp 11/28/24 0257 97.7 F (36.5 C)     Temp Source 11/28/24 0257 Oral     SpO2 11/28/24 0257 100 %     Weight 11/28/24 0021 198 lb 6.6 oz (90 kg)     Height 11/28/24 0021 5' 2 (1.575 m)     Head Circumference --      Peak Flow --      Pain Score 11/28/24 0021 10     Pain Loc --      Pain Education --      Exclude from Growth  Chart --     Most recent vital signs: Vitals:   11/28/24 0257  BP: (!) 139/94  Pulse: 84  Resp: 16  Temp: 97.7 F (36.5 C)  SpO2: 100%     CONSTITUTIONAL: Alert and responds appropriately to questions.  Appears uncomfortable HEAD: Normocephalic, atraumatic EYES: Conjunctivae clear, pupils appear equal ENT: normal nose; moist mucous membranes NECK: Normal range of motion CARD: Regular rate and rhythm RESP: Normal chest excursion without splinting or tachypnea; no hypoxia or respiratory distress, speaking full sentences ABD/GI: non-distended EXT: Bruising, soft tissue swelling, multiple puncture wounds noted to the left dorsal hand and wrist.  She has a 5 cm macerated laceration to the dorsal left hand just proximal to the base of the thumb.  2+ left radial pulse.  Normal capillary refill.  No sign of tendon involvement.  No bony deformity. SKIN: Normal color for age and race, no rashes on exposed skin NEURO: Moves all extremities equally, normal speech, no facial asymmetry noted PSYCH: The patient's mood and manner are appropriate. Grooming and personal hygiene are appropriate.  ED Results / Procedures / Treatments   LABS: (all labs ordered are listed, but only abnormal results are displayed) Labs Reviewed - No data to display   EKG:    RADIOLOGY: My personal review and interpretation of imaging: X-ray showed no fracture or foreign body.  I have personally reviewed all radiology reports. DG Wrist Complete Left Result Date: 11/28/2024 EXAM: 3 OR MORE VIEW(S) XRAY OF THE LEFT WRIST 11/28/2024 02:54:36 AM COMPARISON: None available. CLINICAL HISTORY: Dog bite, bony tenderness. FINDINGS: BONES AND JOINTS: No acute fracture. No malalignment. SOFT TISSUES: Surgical clips in ventral soft tissues. Mild diffuse soft tissue swelling. IMPRESSION: 1. No acute fracture or dislocation; mild diffuse soft tissue swelling. Electronically signed by: Norman Gatlin MD 11/28/2024 03:06 AM EST  RP Workstation: [id number]   DG Hand Complete Left Result Date: 11/28/2024 EXAM: 3 OR MORE VIEW(S) XRAY OF THE LEFT HAND 11/28/2024 02:54:36 AM COMPARISON: None available. CLINICAL HISTORY: Dog bite, bony tenderness. FINDINGS: BONES AND JOINTS: No acute fracture. No malalignment. SOFT TISSUES: Soft tissue swelling within the left thumb. IMPRESSION: 1. No acute fracture or dislocation. 2. Soft tissue swelling within the left thumb. Electronically signed by: Norman Gatlin MD 11/28/2024 03:06 AM EST RP Workstation: [id number]     PROCEDURES:  Critical Care performed: No     Procedures   LACERATION REPAIR Performed by: Josette Sink Authorized by: Josette Angla Delahunt Consent: Verbal  consent obtained. Risks and benefits: risks, benefits and alternatives were discussed Consent given by: patient Patient identity confirmed: provided demographic data Prepped and Draped in normal sterile fashion Wound explored  Laceration Location: Left hand  Laceration Length: 5cm  No Foreign Bodies seen or palpated  Anesthesia: local infiltration  Local anesthetic: lidocaine  1% without epinephrine  Anesthetic total: 5 ml  Irrigation method: syringe Amount of cleaning: standard  Skin closure: Superficial  Number of sutures: 5  Technique: Area anesthetized using lidocaine  1% without epinephrine. Wound irrigated copiously with sterile saline. Wound then cleaned with Betadine and draped in sterile fashion. Wound closed using 5 simple interrupted sutures with 6-0 Vicryl.  Bacitracin  and sterile dressing applied. Good wound approximation and hemostasis achieved.    Patient tolerance: Patient tolerated the procedure well with no immediate complications.    IMPRESSION / MDM / ASSESSMENT AND PLAN / ED COURSE  I reviewed the triage vital signs and the nursing notes.   Patient here with dog bite to the left hand.     DIFFERENTIAL DIAGNOSIS (includes but not limited to):   Superficial laceration  and puncture wounds, soft tissue contusions, fracture, no sign of tendon or ligamentous involvement, no sign of infection currently  Patient's presentation is most consistent with acute complicated illness / injury requiring diagnostic workup.  PLAN: Will obtain x-rays of the left wrist and hand.  Will give pain medication.  Will start on antibiotics and have her soak her wounds and Betadine and saline.  Patient high risk for infection given she is immunocompromised.  No sign of tendon involvement today.   MEDICATIONS GIVEN IN ED: Medications  oxyCODONE  (Oxy IR/ROXICODONE ) immediate release tablet 5 mg (5 mg Oral Given 11/28/24 0231)  ondansetron  (ZOFRAN -ODT) disintegrating tablet 4 mg (4 mg Oral Given 11/28/24 0230)  amoxicillin -clavulanate (AUGMENTIN ) 875-125 MG per tablet 1 tablet (1 tablet Oral Given 11/28/24 0231)  Tdap (ADACEL ) injection 0.5 mL (0.5 mLs Intramuscular Given 11/28/24 0231)  lidocaine  (PF) (XYLOCAINE ) 1 % injection 5 mL (5 mLs Intradermal Given by Other 11/28/24 0515)  bacitracin  ointment ( Topical Given by Other 11/28/24 0530)  lidocaine -prilocaine  (EMLA ) cream ( Topical Given 11/28/24 0430)  bacitracin  ointment ( Topical Given by Other 11/28/24 0530)     ED COURSE: X-rays reviewed and interpreted by myself and radiologist and show no fracture, retained foreign body.  Attempted laceration repair but patient very apprehensive about wound repair due to her fear of needles.  Will place topical lidocaine  and reassess.   Patient able to tolerate local anesthetic and laceration repair.  Repaired with 5 dissolvable sutures.  Discussed at length supportive care instructions, wound care instructions and return precautions especially given she is immunocompromised.  Recommended close follow-up with her outpatient doctor in 1 week.  Will discharge with pain medication, antibiotics.  Recommended rest, elevation, ice.  Will provide with work note to allow this to heal.  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   At this time, I do not feel there is any life-threatening condition present. I reviewed all nursing notes, vitals, pertinent previous records.  All lab and urine results, EKGs, imaging ordered have been independently reviewed and interpreted by myself.  I reviewed all available radiology reports from any imaging ordered this visit.  Based on my assessment, I feel the patient is safe to be discharged home without further emergent workup and can continue workup as an outpatient as needed. Discussed all findings, treatment plan as well as usual and customary return precautions.  They verbalize understanding and are comfortable with this plan.  Outpatient follow-up has been provided as needed.  All questions have been answered.    CONSULTS:  none   OUTSIDE RECORDS REVIEWED: Reviewed recent family medicine and transplant notes.     FINAL CLINICAL IMPRESSION(S) / ED DIAGNOSES   Final diagnoses:  Dog bite, initial encounter     Rx / DC Orders   ED Discharge Orders          Ordered    amoxicillin -clavulanate (AUGMENTIN ) 875-125 MG tablet  2 times daily        11/28/24 0409    oxyCODONE  (ROXICODONE ) 5 MG immediate release tablet  Every 8 hours PRN        11/28/24 0409    ondansetron  (ZOFRAN -ODT) 4 MG disintegrating tablet  Every 6 hours PRN        11/28/24 0409             Note:  This document was prepared using Dragon voice recognition software and may include unintentional dictation errors.   Alexyss Balzarini, Josette SAILOR, DO 11/28/24 [phone]  "
